# Patient Record
Sex: Female | Born: 1990 | Race: White | Hispanic: No | Smoking: Former smoker
Health system: Southern US, Community
[De-identification: ages and names within clinical notes are randomized; demographics above are authoritative.]

## PROBLEM LIST (undated history)

## (undated) DIAGNOSIS — K219 Gastro-esophageal reflux disease without esophagitis: Secondary | ICD-10-CM

## (undated) DIAGNOSIS — H579 Unspecified disorder of eye and adnexa: Secondary | ICD-10-CM

## (undated) DIAGNOSIS — F32A Depression, unspecified: Secondary | ICD-10-CM

## (undated) DIAGNOSIS — Z789 Other specified health status: Secondary | ICD-10-CM

## (undated) DIAGNOSIS — K5900 Constipation, unspecified: Secondary | ICD-10-CM

## (undated) DIAGNOSIS — F329 Major depressive disorder, single episode, unspecified: Secondary | ICD-10-CM

## (undated) HISTORY — PX: NO PAST SURGERIES: SHX2092

---
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
## 2010-04-17 ENCOUNTER — Emergency Department (HOSPITAL_COMMUNITY): Admission: EM | Admit: 2010-04-17 | Discharge: 2010-04-17 | Payer: Self-pay | Admitting: Family Medicine

## 2010-10-26 NOTE — L&D Delivery Note (Signed)
Delivery Note At 3:57 AM a viable female was delivered via Vaginal, Spontaneous Delivery (Presentation:ROA ).  APGAR: 9/9 ; weight 7+5 .   Placenta status: del spont.  Cord: short, but without other abnormalities.  Anesthesia: Epidural  Episiotomy: none Lacerations: none Est. Blood Loss (mL): 300cc  Mom to postpartum.  Baby to nursery-stable.  Cam Hai 07/15/2011, 4:24 AM

## 2010-11-11 ENCOUNTER — Encounter (INDEPENDENT_AMBULATORY_CARE_PROVIDER_SITE_OTHER): Payer: Self-pay | Admitting: Internal Medicine

## 2010-11-27 NOTE — Letter (Signed)
Summary: RECORDS FROM [facility]//HISTORICAL CHART  RECORDS FROM [facility]//HISTORICAL CHART   Imported By: Arta Bruce 11/11/2010 15:15:22  _____________________________________________________________________  External Attachment:    Type:   Image     Comment:   External Document

## 2011-02-05 ENCOUNTER — Other Ambulatory Visit: Payer: Self-pay | Admitting: Family Medicine

## 2011-02-05 DIAGNOSIS — Z3400 Encounter for supervision of normal first pregnancy, unspecified trimester: Secondary | ICD-10-CM

## 2011-02-05 DIAGNOSIS — Z3689 Encounter for other specified antenatal screening: Secondary | ICD-10-CM

## 2011-02-05 LAB — HEPATITIS B SURFACE ANTIGEN: Hepatitis B Surface Ag: NEGATIVE

## 2011-02-05 LAB — RPR: RPR: NONREACTIVE

## 2011-02-05 LAB — ANTIBODY SCREEN: Antibody Screen: NEGATIVE

## 2011-02-05 LAB — HIV ANTIBODY (ROUTINE TESTING W REFLEX): HIV: NONREACTIVE

## 2011-02-05 LAB — RUBELLA ANTIBODY, IGM: Rubella: IMMUNE

## 2011-02-05 LAB — ABO/RH: RH Type: POSITIVE

## 2011-02-12 ENCOUNTER — Ambulatory Visit (HOSPITAL_COMMUNITY)
Admission: RE | Admit: 2011-02-12 | Discharge: 2011-02-12 | Disposition: A | Discharge status: Home / Self Care | Payer: Medicaid Other | Source: Ambulatory Visit | Attending: Family Medicine | Admitting: Family Medicine

## 2011-02-12 DIAGNOSIS — O358XX0 Maternal care for other (suspected) fetal abnormality and damage, not applicable or unspecified: Secondary | ICD-10-CM | POA: Insufficient documentation

## 2011-02-12 DIAGNOSIS — Z3689 Encounter for other specified antenatal screening: Secondary | ICD-10-CM

## 2011-02-12 DIAGNOSIS — Z363 Encounter for antenatal screening for malformations: Secondary | ICD-10-CM | POA: Insufficient documentation

## 2011-02-12 DIAGNOSIS — Z1389 Encounter for screening for other disorder: Secondary | ICD-10-CM | POA: Insufficient documentation

## 2011-02-19 ENCOUNTER — Encounter (INDEPENDENT_AMBULATORY_CARE_PROVIDER_SITE_OTHER): Payer: Medicaid Other | Admitting: Obstetrics & Gynecology

## 2011-02-19 DIAGNOSIS — Z3480 Encounter for supervision of other normal pregnancy, unspecified trimester: Secondary | ICD-10-CM

## 2011-03-18 ENCOUNTER — Encounter (INDEPENDENT_AMBULATORY_CARE_PROVIDER_SITE_OTHER): Payer: Medicaid Other | Admitting: Family Medicine

## 2011-03-18 DIAGNOSIS — Z3480 Encounter for supervision of other normal pregnancy, unspecified trimester: Secondary | ICD-10-CM

## 2011-04-13 ENCOUNTER — Encounter (INDEPENDENT_AMBULATORY_CARE_PROVIDER_SITE_OTHER): Payer: Medicaid Other | Admitting: Obstetrics and Gynecology

## 2011-04-13 DIAGNOSIS — Z3480 Encounter for supervision of other normal pregnancy, unspecified trimester: Secondary | ICD-10-CM

## 2011-04-16 ENCOUNTER — Encounter: Payer: Medicaid Other | Admitting: Obstetrics & Gynecology

## 2011-04-28 ENCOUNTER — Encounter (INDEPENDENT_AMBULATORY_CARE_PROVIDER_SITE_OTHER): Payer: Medicaid Other | Admitting: Obstetrics & Gynecology

## 2011-04-28 DIAGNOSIS — Z3400 Encounter for supervision of normal first pregnancy, unspecified trimester: Secondary | ICD-10-CM

## 2011-04-28 DIAGNOSIS — IMO0002 Reserved for concepts with insufficient information to code with codable children: Secondary | ICD-10-CM

## 2011-05-14 ENCOUNTER — Encounter (INDEPENDENT_AMBULATORY_CARE_PROVIDER_SITE_OTHER): Payer: Medicaid Other | Admitting: Obstetrics & Gynecology

## 2011-05-14 DIAGNOSIS — Z3400 Encounter for supervision of normal first pregnancy, unspecified trimester: Secondary | ICD-10-CM

## 2011-05-14 DIAGNOSIS — IMO0002 Reserved for concepts with insufficient information to code with codable children: Secondary | ICD-10-CM

## 2011-06-10 ENCOUNTER — Encounter (INDEPENDENT_AMBULATORY_CARE_PROVIDER_SITE_OTHER): Payer: Medicaid Other | Admitting: Obstetrics & Gynecology

## 2011-06-10 DIAGNOSIS — Z3480 Encounter for supervision of other normal pregnancy, unspecified trimester: Secondary | ICD-10-CM

## 2011-06-10 DIAGNOSIS — IMO0002 Reserved for concepts with insufficient information to code with codable children: Secondary | ICD-10-CM

## 2011-06-17 ENCOUNTER — Ambulatory Visit (INDEPENDENT_AMBULATORY_CARE_PROVIDER_SITE_OTHER): Payer: Medicaid Other | Admitting: Obstetrics and Gynecology

## 2011-06-17 DIAGNOSIS — Z3480 Encounter for supervision of other normal pregnancy, unspecified trimester: Secondary | ICD-10-CM

## 2011-06-17 DIAGNOSIS — IMO0002 Reserved for concepts with insufficient information to code with codable children: Secondary | ICD-10-CM

## 2011-06-17 LAB — STREP B DNA PROBE: GBS: NEGATIVE

## 2011-06-18 LAB — GC/CHLAMYDIA PROBE AMP, GENITAL
Chlamydia, DNA Probe: NEGATIVE
GC Probe Amp, Genital: NEGATIVE

## 2011-06-20 LAB — CULTURE, BETA STREP (GROUP B ONLY)

## 2011-06-23 ENCOUNTER — Encounter (INDEPENDENT_AMBULATORY_CARE_PROVIDER_SITE_OTHER): Payer: Medicaid Other | Admitting: Family Medicine

## 2011-06-23 DIAGNOSIS — Z3400 Encounter for supervision of normal first pregnancy, unspecified trimester: Secondary | ICD-10-CM

## 2011-06-30 ENCOUNTER — Inpatient Hospital Stay (HOSPITAL_COMMUNITY)
Admission: EM | Admit: 2011-06-30 | Discharge: 2011-06-30 | Disposition: A | Discharge status: Home / Self Care | Payer: Medicaid Other | Source: Ambulatory Visit | Attending: Obstetrics & Gynecology | Admitting: Obstetrics & Gynecology

## 2011-06-30 ENCOUNTER — Encounter (HOSPITAL_COMMUNITY): Payer: Self-pay | Admitting: *Deleted

## 2011-06-30 ENCOUNTER — Encounter (INDEPENDENT_AMBULATORY_CARE_PROVIDER_SITE_OTHER): Payer: Medicaid Other | Admitting: Family Medicine

## 2011-06-30 DIAGNOSIS — O479 False labor, unspecified: Secondary | ICD-10-CM | POA: Insufficient documentation

## 2011-06-30 DIAGNOSIS — Z3400 Encounter for supervision of normal first pregnancy, unspecified trimester: Secondary | ICD-10-CM

## 2011-06-30 HISTORY — DX: Other specified health status: Z78.9

## 2011-06-30 NOTE — ED Notes (Signed)
Cervix exam 1.5/50/vt/-2 (posterior) called to Philipp Deputy CNM. Orders for discharge received.

## 2011-06-30 NOTE — Progress Notes (Signed)
SAYS HER UC STARTED AT 6PM.  [facility]- AT [facility]-  VE- TODAY- 2 CM.   DENIES HSV AND MRSA.

## 2011-07-07 ENCOUNTER — Encounter (INDEPENDENT_AMBULATORY_CARE_PROVIDER_SITE_OTHER): Payer: Medicaid Other | Admitting: Family Medicine

## 2011-07-07 DIAGNOSIS — Z3400 Encounter for supervision of normal first pregnancy, unspecified trimester: Secondary | ICD-10-CM

## 2011-07-14 ENCOUNTER — Encounter (HOSPITAL_COMMUNITY): Payer: Self-pay | Admitting: Anesthesiology

## 2011-07-14 ENCOUNTER — Inpatient Hospital Stay (HOSPITAL_COMMUNITY)
Admission: AD | Admit: 2011-07-14 | Discharge: 2011-07-17 | DRG: 775 | Disposition: A | Discharge status: Home / Self Care | Payer: Medicaid Other | Source: Ambulatory Visit | Attending: Obstetrics & Gynecology | Admitting: Obstetrics & Gynecology

## 2011-07-14 ENCOUNTER — Encounter: Payer: Medicaid Other | Admitting: Obstetrics & Gynecology

## 2011-07-14 ENCOUNTER — Encounter (HOSPITAL_COMMUNITY): Payer: Self-pay | Admitting: *Deleted

## 2011-07-14 ENCOUNTER — Inpatient Hospital Stay (HOSPITAL_COMMUNITY)
Admission: AD | Admit: 2011-07-14 | Discharge: 2011-07-14 | Disposition: A | Discharge status: Home / Self Care | Payer: Medicaid Other | Source: Ambulatory Visit | Attending: Obstetrics & Gynecology | Admitting: Obstetrics & Gynecology

## 2011-07-14 ENCOUNTER — Inpatient Hospital Stay (HOSPITAL_COMMUNITY): Payer: Medicaid Other | Admitting: Anesthesiology

## 2011-07-14 DIAGNOSIS — O479 False labor, unspecified: Secondary | ICD-10-CM | POA: Insufficient documentation

## 2011-07-14 LAB — URINALYSIS, ROUTINE W REFLEX MICROSCOPIC
Bilirubin Urine: NEGATIVE
Glucose, UA: NEGATIVE mg/dL
Hgb urine dipstick: NEGATIVE
Ketones, ur: NEGATIVE mg/dL
Nitrite: NEGATIVE
Protein, ur: NEGATIVE mg/dL
Specific Gravity, Urine: 1.01 (ref 1.005–1.030)
Urobilinogen, UA: 0.2 mg/dL (ref 0.0–1.0)
pH: 7 (ref 5.0–8.0)

## 2011-07-14 LAB — CBC
HCT: 41.6 % (ref 36.0–46.0)
Hemoglobin: 14.4 g/dL (ref 12.0–15.0)
MCH: 30.5 pg (ref 26.0–34.0)
MCHC: 34.6 g/dL (ref 30.0–36.0)
MCV: 88.1 fL (ref 78.0–100.0)
Platelets: 356 10*3/uL (ref 150–400)
RBC: 4.72 MIL/uL (ref 3.87–5.11)
RDW: 13.8 % (ref 11.5–15.5)
WBC: 17.1 10*3/uL — ABNORMAL HIGH (ref 4.0–10.5)

## 2011-07-14 LAB — URINE MICROSCOPIC-ADD ON

## 2011-07-14 MED ORDER — ONDANSETRON HCL 4 MG/2ML IJ SOLN: 4.0000 mg | Freq: Four times a day (QID) | INTRAMUSCULAR | Status: DC | PRN

## 2011-07-14 MED ORDER — ACETAMINOPHEN 325 MG PO TABS: 650.0000 mg | ORAL_TABLET | ORAL | Status: DC | PRN

## 2011-07-14 MED ORDER — NALBUPHINE SYRINGE 5 MG/0.5 ML
10.0000 mg | INJECTION | Freq: Once | INTRAMUSCULAR | Status: AC
  Administered 2011-07-14: 10 mg via INTRAMUSCULAR
  Filled 2011-07-14: qty 1

## 2011-07-14 MED ORDER — FLEET ENEMA 7-19 GM/118ML RE ENEM: 1.0000 | ENEMA | RECTAL | Status: DC | PRN

## 2011-07-14 MED ORDER — FENTANYL 2.5 MCG/ML BUPIVACAINE 1/10 % EPIDURAL INFUSION (WH - ANES)
14.0000 mL/h | INTRAMUSCULAR | Status: DC
  Administered 2011-07-14 – 2011-07-15 (×2): 14 mL/h via EPIDURAL
  Filled 2011-07-14 (×3): qty 60

## 2011-07-14 MED ORDER — LACTATED RINGERS IV SOLN
INTRAVENOUS | Status: DC
  Administered 2011-07-14: 19:00:00 via INTRAVENOUS
  Administered 2011-07-14: 400 mL via INTRAVENOUS
  Administered 2011-07-14 – 2011-07-15 (×2): via INTRAVENOUS

## 2011-07-14 MED ORDER — OXYCODONE-ACETAMINOPHEN 5-325 MG PO TABS: 2.0000 | ORAL_TABLET | ORAL | Status: DC | PRN

## 2011-07-14 MED ORDER — IBUPROFEN 600 MG PO TABS
600.0000 mg | ORAL_TABLET | Freq: Four times a day (QID) | ORAL | Status: DC | PRN
  Administered 2011-07-15: 600 mg via ORAL
  Filled 2011-07-14: qty 1

## 2011-07-14 MED ORDER — DIPHENHYDRAMINE HCL 50 MG/ML IJ SOLN: 12.5000 mg | INTRAMUSCULAR | Status: DC | PRN

## 2011-07-14 MED ORDER — OXYTOCIN BOLUS FROM INFUSION
500.0000 mL | Freq: Once | INTRAVENOUS | Status: DC
  Filled 2011-07-14: qty 500

## 2011-07-14 MED ORDER — EPHEDRINE 5 MG/ML INJ
10.0000 mg | INTRAVENOUS | Status: DC | PRN
  Filled 2011-07-14: qty 4

## 2011-07-14 MED ORDER — PHENYLEPHRINE 40 MCG/ML (10ML) SYRINGE FOR IV PUSH (FOR BLOOD PRESSURE SUPPORT)
80.0000 ug | PREFILLED_SYRINGE | INTRAVENOUS | Status: DC | PRN
  Filled 2011-07-14: qty 5

## 2011-07-14 MED ORDER — OXYTOCIN 20 UNITS IN LACTATED RINGERS INFUSION - SIMPLE
1.0000 m[IU]/min | INTRAVENOUS | Status: DC
  Administered 2011-07-14: 1 m[IU]/min via INTRAVENOUS
  Filled 2011-07-14: qty 1000

## 2011-07-14 MED ORDER — PHENYLEPHRINE 40 MCG/ML (10ML) SYRINGE FOR IV PUSH (FOR BLOOD PRESSURE SUPPORT)
80.0000 ug | PREFILLED_SYRINGE | INTRAVENOUS | Status: DC | PRN
  Filled 2011-07-14 (×2): qty 5

## 2011-07-14 MED ORDER — LACTATED RINGERS IV SOLN: 500.0000 mL | Freq: Once | INTRAVENOUS | Status: DC

## 2011-07-14 MED ORDER — OXYTOCIN 20 UNITS IN LACTATED RINGERS INFUSION - SIMPLE
125.0000 mL/h | Freq: Once | INTRAVENOUS | Status: AC
  Administered 2011-07-15: 999 mL/h via INTRAVENOUS

## 2011-07-14 MED ORDER — FENTANYL 2.5 MCG/ML BUPIVACAINE 1/10 % EPIDURAL INFUSION (WH - ANES)
INTRAMUSCULAR | Status: DC | PRN
  Administered 2011-07-14: 14 mL/h via EPIDURAL

## 2011-07-14 MED ORDER — LACTATED RINGERS IV SOLN: 500.0000 mL | INTRAVENOUS | Status: DC | PRN

## 2011-07-14 MED ORDER — LIDOCAINE HCL (PF) 1 % IJ SOLN
30.0000 mL | INTRAMUSCULAR | Status: DC | PRN
  Filled 2011-07-14 (×2): qty 30

## 2011-07-14 MED ORDER — LIDOCAINE HCL 1.5 % IJ SOLN
INTRAMUSCULAR | Status: DC | PRN
  Administered 2011-07-14 (×2): 5 mL via EPIDURAL

## 2011-07-14 MED ORDER — EPHEDRINE 5 MG/ML INJ
10.0000 mg | INTRAVENOUS | Status: DC | PRN
  Filled 2011-07-14 (×2): qty 4

## 2011-07-14 MED ORDER — TERBUTALINE SULFATE 1 MG/ML IJ SOLN: 0.2500 mg | Freq: Once | INTRAMUSCULAR | Status: AC | PRN

## 2011-07-14 MED ORDER — NALBUPHINE SYRINGE 5 MG/0.5 ML
10.0000 mg | INJECTION | INTRAMUSCULAR | Status: DC | PRN
  Filled 2011-07-14: qty 1

## 2011-07-14 MED ORDER — CITRIC ACID-SODIUM CITRATE 334-500 MG/5ML PO SOLN: 30.0000 mL | ORAL | Status: DC | PRN

## 2011-07-14 NOTE — ED Provider Notes (Signed)
I saw patient to re-evaluate for cervical change after an hour of monitoring.  Contractions have spaced out, but are still rated as strong. Dilation: 3 Effacement (%): 80 Cervical Position: Middle Station: -3 Presentation: Vertex Exam by:: Dr. Elwyn Reach  There has been no change in her cervical exam.  As she is uncomfortable and did not feel like she would be able to rest at home will give nubain 10mg  IM before discharge. -provided labor precautions -discharge home  Discussed plan of care with Philipp Deputy, CNM BOOTH, ERIN 07/14/2011 11:36 AM

## 2011-07-14 NOTE — Progress Notes (Signed)
Pt in for labor eval reports ucs since last night, has progressively gotten worse.  Denies any leaking of fluid or bleeding. + FM.

## 2011-07-14 NOTE — Anesthesia Procedure Notes (Signed)
Epidural Patient location during procedure: OB Start time: 07/14/2011 7:36 PM End time: 07/14/2011 7:45 PM Reason for block: procedure for pain  Staffing Anesthesiologist: Sandrea Hughs Performed by: anesthesiologist   Preanesthetic Checklist Completed: patient identified, site marked, surgical consent, pre-op evaluation, timeout performed, IV checked, risks and benefits discussed and monitors and equipment checked  Epidural Patient position: sitting Prep: site prepped and draped and DuraPrep Patient monitoring: continuous pulse ox and blood pressure Approach: midline Injection technique: LOR air  Needle:  Needle type: Tuohy  Needle gauge: 17 G Needle length: 9 cm Needle insertion depth: 7 cm Catheter type: closed end flexible Catheter size: 19 Gauge Catheter at skin depth: 12 cm Test dose: negative and 1.5% lidocaine  Assessment Sensory level: T8 Events: blood not aspirated, injection not painful, no injection resistance, negative IV test and no paresthesia

## 2011-07-14 NOTE — Anesthesia Preprocedure Evaluation (Signed)
Anesthesia Evaluation  Name, MR# and DOB Patient awake  General Assessment Comment  Reviewed: Allergy & Precautions, H&P , NPO status , Patient's Chart, lab work & pertinent test results  Airway Mallampati: III TM Distance: >3 FB Neck ROM: full    Dental No notable dental hx.    Pulmonary  clear to auscultation  pulmonary exam normalPulmonary Exam Normal breath sounds clear to auscultation none    Cardiovascular     Neuro/Psych Negative Neurological ROS  Negative Psych ROS  GI/Hepatic/Renal negative GI ROS  negative Liver ROS  negative Renal ROS        Endo/Other  Negative Endocrine ROS (+)      Abdominal (+) obese,   Musculoskeletal negative musculoskeletal ROS (+)   Hematology negative hematology ROS (+)   Peds  Reproductive/Obstetrics (+) Pregnancy    Anesthesia Other Findings             Anesthesia Physical Anesthesia Plan  ASA: III  Anesthesia Plan: Epidural   Post-op Pain Management:    Induction:   Airway Management Planned:   Additional Equipment:   Intra-op Plan:   Post-operative Plan:   Informed Consent: I have reviewed the patients History and Physical, chart, labs and discussed the procedure including the risks, benefits and alternatives for the proposed anesthesia with the patient or authorized representative who has indicated his/her understanding and acceptance.     Plan Discussed with:   Anesthesia Plan Comments:         Anesthesia Quick Evaluation

## 2011-07-14 NOTE — ED Provider Notes (Signed)
agree

## 2011-07-14 NOTE — Progress Notes (Signed)
Leslie Duran is a 20 y.o. G1P0 at [redacted]w[redacted]d    Subjective: Comfortable with epidural  Objective: BP 123/69  Pulse 87  Temp(Src) 98.3 F (36.8 C) (Oral)  Resp 18  Ht 5\' 4"  (1.626 m)  Wt 106.777 kg (235 lb 6.4 oz)  BMI 40.41 kg/m2  SpO2 100%      FHT:  FHR: 145 bpm, variability: moderate,  accelerations:  Present,  decelerations:  Absent; currently in sleep cycle, but prior to that with many 15x15 accels UC:   regular, every 2-4 minutes with 3 mu/min of Pitocin SVE:   Dilation: 5 Effacement (%): 80 Station: -1 Exam by:: Lilli Few, RN  Labs: Lab Results  Component Value Date   WBC 17.1* 07/14/2011   HGB 14.4 07/14/2011   HCT 41.6 07/14/2011   MCV 88.1 07/14/2011   PLT 356 07/14/2011    Assessment / Plan: Augmentation of labor, progressing well  Will continue to obs and reeval cx q 2 hours approx. Consider AROM when experiencing more cx change. Anticipate SVD.  Cam Hai 07/14/2011, 10:49 PM

## 2011-07-14 NOTE — H&P (Signed)
Leslie Duran is a 20 y.o. female presenting for contractions that have been going on all day.  She was here for a labor eval earlier today and d/c home with cx 3/80 and given IM Nubain prior to d/c. Now ctx have intensified. Denies leaking or bldg. Reports +FM. History OB History    Grav Para Term Preterm Abortions TAB SAB Ect Mult Living   1              Past Medical History  Diagnosis Date  . No pertinent past medical history   . Blood dyscrasia    Past Surgical History  Procedure Date  . No past surgeries    Family History: family history is not on file. Social History:  reports that she has never smoked. She has never used smokeless tobacco. She reports that she does not drink alcohol or use illicit drugs.  ROS  Dilation: 4 Effacement (%):  (85) Station: -2 Exam by:: Cletis Media, RN Blood pressure 128/77, pulse 97, temperature 97.6 F (36.4 C), temperature source Oral, resp. rate 22, height 5\' 4"  (1.626 m). Maternal Exam:  Uterine Assessment: Contraction strength is mild.  Contraction frequency is regular.   Abdomen: Patient reports no abdominal tenderness. Fetal presentation: vertex     Fetal Exam Fetal Monitor Review: Baseline rate: 130.  Variability: moderate (6-25 bpm).   Pattern: accelerations present and no decelerations.    Fetal State Assessment: Category I - tracings are normal.     Physical Exam  Constitutional: She is oriented to person, place, and time. She appears well-developed and well-nourished.  HENT:  Head: Normocephalic.  Respiratory: Effort normal.  Neurological: She is alert and oriented to person, place, and time.  Skin: Skin is warm and dry.  Psychiatric: She has a normal mood and affect.    Prenatal labs: ABO, Rh: O/Positive/-- (04/12 0000) Antibody: Negative (04/12 0000) Rubella:   RPR: Nonreactive (04/12 0000)  HBsAg: Negative (04/12 0000)  HIV: Non-reactive (04/12 0000)  GBS: Negative (08/22 0000)    Assessment/Plan: IUP at term Latent labor GBS neg  Will admit to L&D and give pain meds/epid as pt desires.  Expectant management. Anticipate SVD.   Cam Hai 07/14/2011, 7:34 PM

## 2011-07-14 NOTE — Progress Notes (Signed)
Pt returns to [facility] for labor check, reports ucs q3-4 minutes since leaving MAU earlier.  Denies any bleeding or lof.  + FM.

## 2011-07-15 ENCOUNTER — Encounter (HOSPITAL_COMMUNITY): Payer: Self-pay | Admitting: *Deleted

## 2011-07-15 LAB — RPR: RPR Ser Ql: NONREACTIVE

## 2011-07-15 MED ORDER — HYDROCORTISONE ACE-PRAMOXINE 1-1 % RE CREA
TOPICAL_CREAM | Freq: Three times a day (TID) | RECTAL | Status: DC
  Administered 2011-07-15 – 2011-07-17 (×3): via RECTAL
  Filled 2011-07-15: qty 30

## 2011-07-15 MED ORDER — TETANUS-DIPHTH-ACELL PERTUSSIS 5-2.5-18.5 LF-MCG/0.5 IM SUSP
0.5000 mL | Freq: Once | INTRAMUSCULAR | Status: AC
  Administered 2011-07-16: 0.5 mL via INTRAMUSCULAR
  Filled 2011-07-15: qty 0.5

## 2011-07-15 MED ORDER — BENZOCAINE-MENTHOL 20-0.5 % EX AERO
INHALATION_SPRAY | CUTANEOUS | Status: AC
  Filled 2011-07-15: qty 56

## 2011-07-15 MED ORDER — ONDANSETRON HCL 4 MG PO TABS: 4.0000 mg | ORAL_TABLET | ORAL | Status: DC | PRN

## 2011-07-15 MED ORDER — SENNOSIDES-DOCUSATE SODIUM 8.6-50 MG PO TABS
2.0000 | ORAL_TABLET | Freq: Every day | ORAL | Status: DC
Start: 2011-07-15 — End: 2011-07-17
  Administered 2011-07-15 – 2011-07-16 (×2): 2 via ORAL

## 2011-07-15 MED ORDER — LANOLIN HYDROUS EX OINT: TOPICAL_OINTMENT | CUTANEOUS | Status: DC | PRN

## 2011-07-15 MED ORDER — ONDANSETRON HCL 4 MG/2ML IJ SOLN: 4.0000 mg | INTRAMUSCULAR | Status: DC | PRN

## 2011-07-15 MED ORDER — DIBUCAINE 1 % RE OINT: 1.0000 "application " | TOPICAL_OINTMENT | RECTAL | Status: DC | PRN

## 2011-07-15 MED ORDER — ZOLPIDEM TARTRATE 5 MG PO TABS: 5.0000 mg | ORAL_TABLET | Freq: Every evening | ORAL | Status: DC | PRN

## 2011-07-15 MED ORDER — OXYCODONE-ACETAMINOPHEN 5-325 MG PO TABS
1.0000 | ORAL_TABLET | ORAL | Status: DC | PRN
  Administered 2011-07-16: 1 via ORAL
  Filled 2011-07-15: qty 1

## 2011-07-15 MED ORDER — SIMETHICONE 80 MG PO CHEW: 80.0000 mg | CHEWABLE_TABLET | ORAL | Status: DC | PRN

## 2011-07-15 MED ORDER — WITCH HAZEL-GLYCERIN EX PADS
1.0000 "application " | MEDICATED_PAD | CUTANEOUS | Status: DC | PRN
  Administered 2011-07-15: 1 via TOPICAL

## 2011-07-15 MED ORDER — IBUPROFEN 600 MG PO TABS
600.0000 mg | ORAL_TABLET | Freq: Four times a day (QID) | ORAL | Status: DC
  Administered 2011-07-15 – 2011-07-17 (×9): 600 mg via ORAL
  Filled 2011-07-15 (×9): qty 1

## 2011-07-15 MED ORDER — PRENATAL PLUS 27-1 MG PO TABS
1.0000 | ORAL_TABLET | Freq: Every day | ORAL | Status: DC
  Administered 2011-07-15 – 2011-07-17 (×3): 1 via ORAL
  Filled 2011-07-15 (×3): qty 1

## 2011-07-15 MED ORDER — DIPHENHYDRAMINE HCL 25 MG PO CAPS: 25.0000 mg | ORAL_CAPSULE | Freq: Four times a day (QID) | ORAL | Status: DC | PRN

## 2011-07-15 MED ORDER — BENZOCAINE-MENTHOL 20-0.5 % EX AERO
1.0000 "application " | INHALATION_SPRAY | CUTANEOUS | Status: DC | PRN
  Administered 2011-07-15: 1 via TOPICAL

## 2011-07-15 NOTE — Progress Notes (Signed)
UR chart review completed.  

## 2011-07-15 NOTE — Progress Notes (Signed)
Leslie Duran is a 20 y.o. G1P0 at [redacted]w[redacted]d  Subjective: Mostly comfortable with epidural, although having some irritation from the foley cath as well as pressure from hemorrhoids. RN unable to trace contractions well or consistently secondary to pt body habitus.  Objective: BP 121/69  Pulse 93  Temp(Src) 98.4 F (36.9 C) (Oral)  Resp 16  Ht 5\' 4"  (1.626 m)  Wt 106.777 kg (235 lb 6.4 oz)  BMI 40.41 kg/m2  SpO2 100%      FHT:  FHR: 145 bpm, variability: moderate,  accelerations:  Present,  decelerations:  Absent UC:   Appear to be every 3-5 but difficult to asses with toco; Pit at 70mu/min SVE:   7/100%/0  AROM for clear fluid and IUPC inserted Labs: Lab Results  Component Value Date   WBC 17.1* 07/14/2011   HGB 14.4 07/14/2011   HCT 41.6 07/14/2011   MCV 88.1 07/14/2011   PLT 356 07/14/2011    Assessment / Plan: Augmentation of labor, progressing well  Will reeval cervix in approx 2 hours or sooner with rectal pressure Proctocream HC to bedside for hemorrhoids SHAW, KIMBERLY 07/15/2011, 2:31 AM

## 2011-07-16 NOTE — Progress Notes (Signed)
Post Partum Day 1 Subjective: no complaints, up ad lib, voiding, tolerating PO, + flatus and pain in vagina and back that is controlled with medications.  Br feeding, has not seen lactation yet.  Progrestin-only pill vs nexplanon.  Objective: Blood pressure 108/71, pulse 97, temperature 97.9 F (36.6 C), temperature source Oral, resp. rate 18, height 5\' 4"  (1.626 m), weight 235 lb 6.4 oz (106.777 kg), SpO2 98.00%, unknown if currently breastfeeding.  Physical Exam:  General: alert, cooperative, appears stated age and no distress Lochia: appropriate Uterine Fundus: firm DVT Evaluation: No evidence of DVT seen on physical exam. Negative Homan's sign. No significant calf/ankle edema.   Basename 07/14/11 1801  HGB 14.4  HCT 41.6    Assessment/Plan: Plan for discharge tomorrow, Breastfeeding and Lactation consult   LOS: 2 days   BOOTH, Rishard Delange 07/16/2011, 8:45 AM

## 2011-07-17 MED ORDER — IBUPROFEN 600 MG PO TABS: 600.0000 mg | ORAL_TABLET | Freq: Four times a day (QID) | ORAL | Status: AC

## 2011-07-17 NOTE — Progress Notes (Signed)
Post Partum Day 2 Subjective: no complaints, up ad lib, voiding, tolerating PO, + flatus and +BM.  Seen by lactation yesterday - upset as baby will no longer latch on after being seen by lactation.  Objective: Blood pressure 121/86, pulse 93, temperature 98.1 F (36.7 C), temperature source Oral, resp. rate 18, height 5\' 4"  (1.626 m), weight 235 lb 6.4 oz (106.777 kg), SpO2 98.00%, unknown if currently breastfeeding.  Physical Exam:  General: alert, cooperative, appears stated age and no distress Lochia: appropriate Uterine Fundus: firm DVT Evaluation: No evidence of DVT seen on physical exam. Negative Homan's sign. No significant calf/ankle edema.   Basename 07/14/11 1801  HGB 14.4  HCT 41.6    Assessment/Plan: Discharge home   LOS: 3 days   BOOTH, Lonni Dirden 07/17/2011, 7:15 AM

## 2011-07-17 NOTE — Discharge Summary (Signed)
Obstetric Discharge Summary Reason for Admission: onset of labor Prenatal Procedures: ultrasound Intrapartum Procedures: spontaneous vaginal delivery Postpartum Procedures: none Complications-Operative and Postpartum: none Hemoglobin  Date Value Range Status  07/14/2011 14.4  12.0-15.0 (g/dL) Final     HCT  Date Value Range Status  07/14/2011 41.6  36.0-46.0 (%) Final    Discharge Diagnoses: Term Pregnancy-delivered  Discharge Information: Date: 07/17/2011 Activity: pelvic rest Diet: routine Medications: PNV and Ibuprophen Condition: stable Instructions: refer to practice specific booklet Discharge to: home Follow-up Information    Follow up with [facility] . Call today. (for 4-6 week PP visit)    Contact information:   [address] [place] [address]          Newborn Data: Live born female  Birth Weight: 7 lb 5.3 oz (3325 g) APGAR: 9, 9  Home with mother.  Duran, Leslie Wechter 07/17/2011, 7:31 AM

## 2011-07-17 NOTE — Anesthesia Postprocedure Evaluation (Signed)
Anesthesia Post Note  Patient: Leslie Duran  Procedure(s) Performed: * No procedures listed *  Anesthesia type: General  Patient location: PACU  Post pain: Pain level controlled  Post assessment: Post-op Vital signs reviewed  Last Vitals:  Filed Vitals:   07/17/11 0530  BP: 121/86  Pulse: 93  Temp: 98.1 F (36.7 C)  Resp: 18    Post vital signs: Reviewed  Level of consciousness: sedated  Complications: No apparent anesthesia complicationsfj

## 2011-07-21 ENCOUNTER — Inpatient Hospital Stay (HOSPITAL_COMMUNITY): Admit: 2011-07-21 | Discharge: 2011-07-21 | Payer: Medicaid Other

## 2011-08-05 ENCOUNTER — Ambulatory Visit (INDEPENDENT_AMBULATORY_CARE_PROVIDER_SITE_OTHER): Payer: Medicaid Other | Admitting: Obstetrics & Gynecology

## 2011-08-05 ENCOUNTER — Encounter: Payer: Self-pay | Admitting: Obstetrics & Gynecology

## 2011-08-05 ENCOUNTER — Ambulatory Visit: Payer: Medicaid Other | Admitting: Obstetrics & Gynecology

## 2011-08-05 VITALS — BP 105/63 | HR 77 | Wt 210.0 lb

## 2011-08-05 DIAGNOSIS — F53 Postpartum depression: Secondary | ICD-10-CM

## 2011-08-05 DIAGNOSIS — O99345 Other mental disorders complicating the puerperium: Secondary | ICD-10-CM

## 2011-08-05 DIAGNOSIS — Z23 Encounter for immunization: Secondary | ICD-10-CM

## 2011-08-05 MED ORDER — FLUOXETINE HCL 20 MG PO CAPS: 20.0000 mg | ORAL_CAPSULE | Freq: Every day | ORAL | Status: DC

## 2011-08-05 NOTE — Progress Notes (Signed)
  Subjective:    Patient ID: Leslie Duran, female    DOB: 08/28/1991, 19 y.o.   MRN: [id number]  HPI She is now 4 weeks status post NSVD and complains of constipation and pp depression.  She has had some thoughts of hurting herself, not baby, but has spokenly openly with her mother and me about this.  She is currently not suicidal.  Her baby is healthy but doesn't sleep much at night.  Her mother is very supportive.  She has not had sex yet.   Review of Systems     Objective:   Physical Exam        Assessment & Plan:  Pp depression- Prozac 20 mg q am.  She is very aware and agrees to tell me or her mother if she is feeling suicidal.  Miralax prn.

## 2011-08-11 ENCOUNTER — Encounter: Payer: Medicaid Other | Admitting: Obstetrics and Gynecology

## 2011-08-26 ENCOUNTER — Ambulatory Visit: Payer: Medicaid Other | Admitting: Family Medicine

## 2011-08-26 ENCOUNTER — Ambulatory Visit (INDEPENDENT_AMBULATORY_CARE_PROVIDER_SITE_OTHER): Payer: Medicaid Other | Admitting: Family Medicine

## 2011-08-26 ENCOUNTER — Encounter: Payer: Self-pay | Admitting: Family Medicine

## 2011-08-26 DIAGNOSIS — IMO0002 Reserved for concepts with insufficient information to code with codable children: Secondary | ICD-10-CM

## 2011-08-26 DIAGNOSIS — O99345 Other mental disorders complicating the puerperium: Secondary | ICD-10-CM

## 2011-08-26 DIAGNOSIS — F53 Postpartum depression: Secondary | ICD-10-CM

## 2011-08-26 NOTE — Progress Notes (Signed)
  Subjective:    Patient ID: Leslie Duran, female    DOB: 28-Jul-1991, 19 y.o.   MRN: [id number]  HPI Here for pp exam.  She has been depressed and is on Prozac which is working for her.  The baby is bottle feeding.  She is not sleeping through the night.  Leslie Duran is fatigued.  She is unsure about contraception at this point.  She is thinking about OC's or Implanon.  She is s/p SVD.   Review of Systems  Constitutional: Positive for fatigue. Negative for fever, activity change and appetite change.  HENT: Negative for congestion, rhinorrhea and sneezing.   Respiratory: Negative for chest tightness, shortness of breath and wheezing.   Cardiovascular: Negative for chest pain and leg swelling.  Gastrointestinal: Negative for nausea, vomiting and abdominal pain.  Genitourinary: Negative for dysuria and menstrual problem.  Psychiatric/Behavioral: Positive for dysphoric mood.       Objective:   Physical Exam  Vitals reviewed. Constitutional: She is oriented to person, place, and time. She appears well-developed and well-nourished.  HENT:  Head: Normocephalic and atraumatic.  Cardiovascular: Normal rate.   Pulmonary/Chest: Effort normal.  Abdominal: Soft. There is no tenderness.  Genitourinary: Vagina normal and uterus normal.       There is a scar noted at 8 o'clock at introitus that is firm and white.  It is well healed.    Neurological: She is alert and oriented to person, place, and time.          Assessment & Plan:  Pp check Depression-continue Prozac Info provided for contraceptive choices. Condoms for now F/u 2 mos for pap and BC

## 2011-08-26 NOTE — Patient Instructions (Signed)
Contraception Choices Birth control (contraception) can stop pregnancy from happening. Different types of birth control work in different ways. Some can:  Make the mucus in the cervix thick. This makes it hard for sperm to get into the uterus.   Thin the lining of the uterus. This makes it hard for an egg to attach to the wall of the uterus.   Stop the ovaries from releasing an egg.   Block the sperm from reaching the egg.  Certain types of surgery can stop pregnancy from happening. For women, the sugery closes the fallopian tubes (tubal ligation). For men, the surgery stops sperm from releasing during sex (vasectomy). HORMONAL BIRTH CONTROL Hormonal birth control stops pregnancy by putting hormones into your body. Types of birth control include:  A small tube put under the skin of the upper arm (implant). The tube can stay in place for 3 years.   Shots given every 3 months.   Pills taken every day or once after sex (intercourse).   Patches that are changed once a week.   A ring put into the vagina (vaginal ring). The ring is left in place for 3 weeks and removed for 1 week. Then, a new ring is put in the vagina.  BARRIER BIRTH CONTROL  Barrier birth control blocks sperm from reaching the egg. Types of birth control include:   A thin covering worn on the penis (female condom) during sex.   A soft, loose covering put into the vagina (female condom) before sex.   A rubber bowl that sits over the cervix (diaphragm). The bowl must be made for you. The bowl is put into the vagina before sex. The bowl is left in place for 6 to 8 hours after sex.   A small, soft cup that fits over the cervix (cervical cap). The cup must be made for you. The cup can be left in place for 48 hours after sex.   A sponge that is put into the vagina before sex.   A chemical that kills or blocks sperm from getting into the cervix and uterus (spermicide). The chemical may be a cream, jelly, foam, or pill.    INTRAUTERINE (IUD) BIRTH CONTROL  IUD birth control is a small, T-shaped piece of plastic. The plastic is put inside the uterus. There are 2 types of IUD:  Copper IUD. The IUD is covered in copper wire. The copper makes a fluid that kills sperm. It can stay in place for 10 years.   Hormone IUD. The hormone stops pregnancy from happening. It can stay in place for 5 years.  NATURAL FAMILY PLANNING BIRTH CONTROL  Natural family planning means not having sex or using barrier birth control when the woman is fertile. A woman can:  Use a calendar to keep track of when she is fertile.   Use a thermometer to measure her body temperature.  Protect yourself against sexual diseases no matter what type of birth control you use. Talk to your doctor about which type of birth control is best for you. Document Released: 08/09/2009 Document Revised: 06/24/2011 Document Reviewed: 02/18/2011 [facility] Patient Information 2012 [place], [place].Postpartum Depression After delivery, your body is going through a drastic change in hormone levels. You may find yourself crying for no apparent reason and unable to cope with all the changes a new baby brings. This is a common response following a pregnancy. Seek support from your partner and/or friends and just give yourself time to recover. If these feelings persist and  you feel you are getting worse, contact your caregiver or other professionals who can help you. WHAT IS DEPRESSION? Depression can be described as feeling sad, blue, unhappy, miserable, or down in the dumps. Most of [place] feel this way at one time or another for short periods. But true clinical depression is a mood disorder in which feelings of sadness, loss, anger, fear, or frustration interfere with everyday life for an extended time. Depression can be mild, moderate, or severe. The degree of depression, which your caregiver can determine, influences your treatment. Postpartum depression occurs within a couple  days to months after delivering your baby. HOW COMMON IS DEPRESSION DURING AND AFTER PREGNANCY? Depression that occurs during pregnancy or within a year after delivery is called perinatal depression. Depression after pregnancy is also called postpartum depression or peripartum depression. The exact number of women with depression during this time is unknown, but it occurs in between 10-15% of women. Researchers believe that depression is one of the most common complications during and after pregnancy. The depression is often not recognized or treated, because some normal pregnancy changes cause similar symptoms and are happening at the same time. Tiredness, problems sleeping, stronger emotional reactions, and changes in body weight may occur during and after pregnancy. But these symptoms may also be signs of depression.  CAUSES  Rapid hormone changes. Estrogen and progesterone usually decrease immediately after delivering your baby. Researchers think the fast change in hormone levels may lead to depression, just as smaller changes in hormones can affect a woman's moods before she gets her menstrual period.   Decrease in thyroid hormone. Thyroid hormone regulates how your body uses and stores energy from food (metabolism). A simple blood test can tell if this condition is causing a woman's depression. If so, thyroid medicine can be prescribed by your caregiver.   A stressful life event, such as a death in the family. This can cause chemical changes in the brain that lead to depression.   Feeling overwhelmed by caring for and raising a new baby.   Depression is also an illness that runs in some families. It is not always clear what causes depression.  FACTORS THAT MAY INCREASE A WOMAN'S CHANCE OF DEPRESSION DURING PREGNANCY:  History of depression.   Substance abuse, alcohol, or drugs.   Little support from family and friends.   Problems with previous pregnancy or birth.   Young age for  motherhood.   Living alone.   Little or no social support.   Family history of mental illness.   Anxiety about the fetus.   Marital or financial problems.   Postpartum depression in a previous pregnancy.   Having a psychiatric illness (schizophrenia, bipolar disorder).   Going through a difficult or stressful pregnancy.   Going through a difficult labor and delivery.   Moving to another city or state during your pregnancy, or just after delivering your baby.  OTHER FACTORS THAT MAY CONTRIBUTE TO POSTPARTUM DEPRESSION INCLUDE:   Feeling tired after delivery, broken sleep patterns, and not getting enough rest. This often keeps a new mother from regaining her full strength for weeks.   Feeling overwhelmed with a new baby to take care of and doubting your ability to be a good mother.   Feeling stress from changes in work and home routines. Women sometimes think they need to be "super mom" or perfect. This is not realistic and can add stress.   Having feelings of loss. This can include loss of the identity of  who you are, or were, before having the baby, loss of control, loss of your pre-pregnancy figure, and feeling less attractive.   Having less free time and less control over your time. Needing to stay home, indoors, for longer periods of time and having less time to spend with your partner and loved ones can contribute to depression.   Having trouble doing your daily activities at home or at work.   Fears about not knowing how to take of the baby correctly and about harming the baby.   Feelings of guilt that you are not taking care of the baby properly.  SYMPTOMS Any of these symptoms, during and after pregnancy, that last longer than 2 weeks are signs of depression:  Feeling restless or irritable.   Feeling sad, hopeless, and overwhelmed.   Crying a lot.   Having no energy or motivation.   Eating too little or too much.   Sleeping too little or too much.   Trouble  focusing, remembering, or making decisions.   Feeling worthless and guilty.   Loss of interest or pleasure in activities.   Withdrawal from friends and family.   Having headaches, chest pains, rapid or irregular heartbeat (palpitations), or fast and shallow breathing (hyperventilation).   After pregnancy, being afraid of hurting the baby or oneself, and not having any interest in the baby.   Not being able to care for yourself or the baby.   Loss of interest in caring for the baby.   Anxiety and panic attacks.   Thoughts of harming yourself, the baby, or someone else.   Feelings of guilt because you feel you are not taking care of the baby well enough.  WHAT IS THE DIFFERENCE BETWEEN "BABY BLUES," POSTPARTUM DEPRESSION, AND POSTPARTUM PSYCHOSIS?  The "baby blues" occurs 70 to 80% of the time, and it can happen in the days right after childbirth. It normally goes away within a few days to a week. A new mother can have sudden mood swings, sadness, crying spells, loss of appetite, sleeping problems, and feel irritable, restless, anxious, and lonely. Symptoms are not severe and treatment usually is not needed. But there are things you can do to feel better. Nap when the baby does. Ask for help from your spouse, family members, and friends. Join a support group of new moms or talk with other moms. If the "baby blues" does not go away in a week to 10 days or gets worse, you may have postpartum depression.   Postpartum depression can happen anytime within the first year after childbirth. A woman may have a number of symptoms, such as sadness, lack of energy, trouble concentrating, anxiety, and feelings of guilt and worthlessness. The difference between postpartum depression and the "baby blues" is that the feelings in postpartum depression are much stronger and often affects a woman's well-being. It keeps her from functioning well for a longer period of time. Postpartum depression needs to be  treated by a caregiver. Counseling, support groups, and medicines can help.   Postpartum psychosis is rare. It occurs in 1 or 2 out of every 1000 births. It usually begins in the first 6 weeks after delivery. Women who have bipolar disorder, schizoaffective disorder, or family history of psychotic disease have a higher risk for developing postpartum psychosis. Symptoms may include delusions, hallucinations, sleep disturbances, and obsessive thoughts about the baby. A woman may have rapid mood swings, from depression, to irritability, to euphoria. This is a serious condition and needs professional care  and treatment.  WHAT STEPS CAN I TAKE IF I HAVE SYMPTOMS OF DEPRESSION DURING PREGNANCY OR AFTER CHILDBIRTH?  Some women do not tell anyone about their symptoms, because they feel embarrassed, ashamed, or guilty about feeling depressed when they are supposed to be happy. They worry that they will be viewed as unfit parents. Perinatal depression can happen to any woman. It does not mean you are a bad or a "not together" mom. You and your baby do not need to suffer. There is help. You should discuss these feelings with your spouse or partner, family, and caregiver.   There are different types of individual and group "talk therapies" that can help a woman with perinatal depression feel better and do better as a mom and as a person. Limited research suggests that many women with perinatal depression improve when treated with antidepressant medicine. Your caregiver can help you learn more about these options and decide which approach is best for you and your baby.   Speak to your caregiver if you are having symptoms of depression while you are pregnant or after you deliver your baby. Your caregiver can give you a questionnaire to test for depression. You can also be referred to a mental health professional who specializes in treating depression.  HOME CARE INSTRUCTIONS  Try to get as much rest as you can. Try to  nap when the baby naps.   Stop putting pressure on yourself to do everything. Do as much as you can and leave the rest.   Ask for help with household chores and nighttime feedings. Ask your partner to bring the baby to you so you can breastfeed. If you can, have a friend, family member, or professional support person help you in the home for part of the day.   Talk to your partner, family, and friends about how you are feeling.   Do not spend a lot of time alone. Get dressed and leave the house. Run an errand or take a short walk.   Spend time alone with your partner.   Talk with other mothers so you can learn from their experiences.   Join a support group for women with depression. Call a local hotline or look in your telephone book for information and services.   Do not make any major life changes during pregnancy. Major changes can cause unneeded stress. However, sometimes big changes cannot be avoided. Arrange support and help in your new situation ahead of time.   Exercise regularly.   Eat a balanced and nourishing diet.   Seek help if there are marital or financial problems.   Take the medicine your caregiver gives, as directed.   Keep all your postpartum appointments.  TREATMENT There are 2 common types of treatment for depression.  Talk therapy. This involves talking to a therapist, psychologist, clergyperson, or social worker, in order to learn to change how depression makes you think, feel, and act.   Medicine. Your caregiver can give you an antidepressant medicine to help you. These medicines can help relieve the symptoms of depression.   Women who are pregnant or breast-feeding should talk with their caregivers about the advantages and risks of taking antidepressant medicines. Some women are concerned that taking these medicines may harm the baby. A mother's depression can affect her baby's development. Getting treatment is important for both mother and baby. The risks  of taking medicine must be weighed against the risks of depression. It is a decision that women need to discuss  carefully with their caregivers. Women who decide to take antidepressant medicines should talk to their caregivers about which antidepressant medicines are safer to take while pregnant or breastfeeding.  What effects can untreated depression have?  Depression not only hurts the mother, but it also affects her family. Some researchers have found that depression during pregnancy can raise the risk of delivering an underweight baby or a premature infant. Some women with depression have difficulty caring for themselves during pregnancy. They may have trouble eating and do not gain enough weight during the pregnancy. They may also have trouble sleeping, may miss prenatal visits, may not follow medical instructions, have a poor diet, or may use harmful substances, like tobacco, alcohol, or illegal drugs.   Postpartum depression can affect a mother's ability to parent. She may lack energy, have trouble concentrating, be irritable, and not be able to meet her child's needs for love and affection. As a result, she may feel guilty and lose confidence in herself as a mother. This can make the depression worse. Researchers believe that postpartum depression can affect the infant by causing delays in language development, problems with emotional bonding to others, behavioral problems, lower activity levels, sleep problems, and distress. It helps if the father or another caregiver can assist in meeting the needs of the baby, and other children in the family, while the mother is depressed.   All children deserve the chance to have a healthy mom. All moms deserve the chance to enjoy their life and their children. Do not suffer alone. If you are experiencing symptoms of depression during pregnancy or after having a baby, tell a loved one and call your caregiver right away.  SEEK MEDICAL CARE IF:  You think you  have postpartum depression.   You want medicine to treat your postpartum depression.   You want a referral to a psychiatrist or psychologist.   You are having a reaction or problems with your medicine.  SEEK IMMEDIATE MEDICAL CARE IF:  You have suicidal feelings.   You feel you may harm the baby.   You feel you may harm your spouse/partner, or someone else.   You feel you need to be admitted to a hospital now.   You feel you are losing control and need treatment immediately.  FOR MORE INFORMATION [employer] operational [employer] Health Information Center: http://hoffman.com/ National Institute of Mental Health, NIH, HHS: http://www.maynard.net/ American Psychological Association: DiceTournament.ca  Postpartum Education for Parents: www.sbpep.org National Mental Health Information Center, SAMHSA, HHS: www.mentalhealth.org  National Mental Health Association: www.nmha.org Postpartum Support International: www.postpartum.net  Document Released: 07/16/2004 Document Revised: 06/24/2011 Document Reviewed: 10/24/2009 [facility] Patient Information 2012 [place], [place].

## 2011-10-22 ENCOUNTER — Ambulatory Visit: Payer: Medicaid Other | Admitting: Obstetrics & Gynecology

## 2012-05-22 ENCOUNTER — Emergency Department: Payer: Self-pay | Admitting: Emergency Medicine

## 2012-08-25 ENCOUNTER — Encounter: Payer: Medicaid Other | Admitting: Family Medicine

## 2012-08-25 ENCOUNTER — Ambulatory Visit (INDEPENDENT_AMBULATORY_CARE_PROVIDER_SITE_OTHER): Payer: Medicaid Other | Admitting: *Deleted

## 2012-08-25 VITALS — BP 107/78 | Wt 233.0 lb

## 2012-08-25 DIAGNOSIS — Z3480 Encounter for supervision of other normal pregnancy, unspecified trimester: Secondary | ICD-10-CM

## 2012-08-25 MED ORDER — PRENATAL PLUS 27-1 MG PO TABS: 1.0000 | ORAL_TABLET | Freq: Every day | ORAL | Status: DC

## 2012-08-25 NOTE — Progress Notes (Signed)
Patient is unsure of her lmp.

## 2012-08-27 LAB — OBSTETRIC PANEL
Antibody Screen: NEGATIVE
Basophils Absolute: 0.1 10*3/uL (ref 0.0–0.1)
Basophils Relative: 1 % (ref 0–1)
Eosinophils Absolute: 0.3 10*3/uL (ref 0.0–0.7)
Eosinophils Relative: 2 % (ref 0–5)
HCT: 39.1 % (ref 36.0–46.0)
Hemoglobin: 12.8 g/dL (ref 12.0–15.0)
Hepatitis B Surface Ag: NEGATIVE
Lymphocytes Relative: 21 % (ref 12–46)
Lymphs Abs: 2.4 10*3/uL (ref 0.7–4.0)
MCH: 28.9 pg (ref 26.0–34.0)
MCHC: 32.7 g/dL (ref 30.0–36.0)
MCV: 88.3 fL (ref 78.0–100.0)
Monocytes Absolute: 0.6 10*3/uL (ref 0.1–1.0)
Monocytes Relative: 5 % (ref 3–12)
Neutro Abs: 8.1 10*3/uL — ABNORMAL HIGH (ref 1.7–7.7)
Neutrophils Relative %: 71 % (ref 43–77)
Platelets: 443 10*3/uL — ABNORMAL HIGH (ref 150–400)
RBC: 4.43 MIL/uL (ref 3.87–5.11)
RDW: 14.6 % (ref 11.5–15.5)
Rh Type: POSITIVE
Rubella: 58.7 IU/mL — ABNORMAL HIGH
WBC: 11.4 10*3/uL — ABNORMAL HIGH (ref 4.0–10.5)

## 2012-08-27 LAB — HIV ANTIBODY (ROUTINE TESTING W REFLEX): HIV: NONREACTIVE

## 2012-08-30 ENCOUNTER — Ambulatory Visit (HOSPITAL_COMMUNITY)
Admission: RE | Admit: 2012-08-30 | Discharge: 2012-08-30 | Disposition: A | Discharge status: Home / Self Care | Payer: Medicaid Other | Source: Ambulatory Visit | Attending: Obstetrics & Gynecology | Admitting: Obstetrics & Gynecology

## 2012-08-30 ENCOUNTER — Other Ambulatory Visit (HOSPITAL_COMMUNITY)
Admission: RE | Admit: 2012-08-30 | Discharge: 2012-08-30 | Disposition: A | Discharge status: Home / Self Care | Payer: Medicaid Other | Source: Ambulatory Visit | Attending: Obstetrics and Gynecology | Admitting: Obstetrics and Gynecology

## 2012-08-30 ENCOUNTER — Ambulatory Visit (INDEPENDENT_AMBULATORY_CARE_PROVIDER_SITE_OTHER): Payer: Medicaid Other | Admitting: Obstetrics and Gynecology

## 2012-08-30 ENCOUNTER — Encounter: Payer: Self-pay | Admitting: Obstetrics and Gynecology

## 2012-08-30 ENCOUNTER — Other Ambulatory Visit: Payer: Self-pay | Admitting: Obstetrics & Gynecology

## 2012-08-30 VITALS — BP 116/55 | Wt 235.0 lb

## 2012-08-30 DIAGNOSIS — F32A Depression, unspecified: Secondary | ICD-10-CM

## 2012-08-30 DIAGNOSIS — Z3689 Encounter for other specified antenatal screening: Secondary | ICD-10-CM | POA: Insufficient documentation

## 2012-08-30 DIAGNOSIS — Z3480 Encounter for supervision of other normal pregnancy, unspecified trimester: Secondary | ICD-10-CM

## 2012-08-30 DIAGNOSIS — Z8659 Personal history of other mental and behavioral disorders: Secondary | ICD-10-CM | POA: Insufficient documentation

## 2012-08-30 DIAGNOSIS — Z113 Encounter for screening for infections with a predominantly sexual mode of transmission: Secondary | ICD-10-CM | POA: Insufficient documentation

## 2012-08-30 DIAGNOSIS — Z01419 Encounter for gynecological examination (general) (routine) without abnormal findings: Secondary | ICD-10-CM | POA: Insufficient documentation

## 2012-08-30 DIAGNOSIS — O09299 Supervision of pregnancy with other poor reproductive or obstetric history, unspecified trimester: Secondary | ICD-10-CM

## 2012-08-30 DIAGNOSIS — O99891 Other specified diseases and conditions complicating pregnancy: Secondary | ICD-10-CM

## 2012-08-30 DIAGNOSIS — F329 Major depressive disorder, single episode, unspecified: Secondary | ICD-10-CM

## 2012-08-30 DIAGNOSIS — Z302 Encounter for sterilization: Secondary | ICD-10-CM

## 2012-08-30 DIAGNOSIS — F3289 Other specified depressive episodes: Secondary | ICD-10-CM

## 2012-08-30 MED ORDER — INFLUENZA VIRUS VACC SPLIT PF IM SUSP: 0.5000 mL | Freq: Once | INTRAMUSCULAR | Status: DC

## 2012-08-30 NOTE — Progress Notes (Signed)
   Subjective:    Leslie Duran is a G2P1001 [redacted]w[redacted]d being seen today for her first obstetrical visit.  Her obstetrical history is significant for h/o postpartum depression. Patient does intend to breast feed. Pregnancy history fully reviewed. Patient planning on BTL for birth control.  Patient reports no obstetrical complaints but symptoms of depression. Patient has a prescription for zoloft which she is planning to fill today. It has helped her in the past successfully.  There were no vitals filed for this visit.  HISTORY: OB History    Grav Para Term Preterm Abortions TAB SAB Ect Mult Living   2 1 1       1      # Outc Date GA Lbr Len/2nd Wgt Sex Del Anes PTL Lv   1 TRM 9/12 [redacted]w[redacted]d 30:30 / 00:27 7lb5.3oz(3.325kg) F SVD EPI  Yes   2 CUR              Past Medical History  Diagnosis Date  . No pertinent past medical history    Past Surgical History  Procedure Date  . No past surgeries    Family History  Problem Relation Age of Onset  . Diabetes Maternal Grandmother   . Heart disease Maternal Grandmother   . Stroke Maternal Grandmother      Exam    Uterus:     Pelvic Exam:    Perineum: Normal Perineum   Vulva: normal   Vagina:  normal mucosa, normal discharge   pH:    Cervix: multiparous appearance, closed and long   Adnexa: no mass, fullness, tenderness   Bony Pelvis: android  System: Breast:  normal appearance, no masses or tenderness   Skin: normal coloration and turgor, no rashes    Neurologic: oriented, grossly non-focal   Extremities: normal strength, tone, and muscle mass   HEENT extra ocular movement intact   Mouth/Teeth mucous membranes moist, pharynx normal without lesions and dental hygiene good   Neck supple and no masses   Cardiovascular: regular rate and rhythm   Respiratory:  chest clear, no wheezing, crepitations, rhonchi, normal symmetric air entry   Abdomen: soft, non-tender; bowel sounds normal; no masses,  no organomegaly   Urinary:       Assessment:    Pregnancy: G2P1001 Patient Active Problem List  Diagnosis  . Postpartum depression  . History of postpartum depression, currently pregnant  . Supervision of other normal pregnancy        Plan:     Initial labs drawn. Prenatal vitamins. Problem list reviewed and updated. Genetic Screening discussed Quad Screen: declined.  Ultrasound discussed; fetal survey: ordered.  Follow up in 4 weeks. 50% of 30 min visit spent on counseling and coordination of care.     Bette Brienza 08/30/2012

## 2012-08-30 NOTE — Patient Instructions (Signed)
Pregnancy - Second Trimester The second trimester of pregnancy (3 to 6 months) is a period of rapid growth for you and your baby. At the end of the sixth month, your baby is about 9 inches long and weighs 1 1/2 pounds. You will begin to feel the baby move between 18 and 20 weeks of the pregnancy. This is called quickening. Weight gain is faster. A clear fluid (colostrum) may leak out of your breasts. You may feel small contractions of the womb (uterus). This is known as false labor or Braxton-Hicks contractions. This is like a practice for labor when the baby is ready to be born. Usually, the problems with morning sickness have usually passed by the end of your first trimester. Some women develop small dark blotches (called cholasma, mask of pregnancy) on their face that usually goes away after the baby is born. Exposure to the sun makes the blotches worse. Acne may also develop in some pregnant women and pregnant women who have acne, may find that it goes away. PRENATAL EXAMS  Blood work may continue to be done during prenatal exams. These tests are done to check on your health and the probable health of your baby. Blood work is used to follow your blood levels (hemoglobin). Anemia (low hemoglobin) is common during pregnancy. Iron and vitamins are given to help prevent this. You will also be checked for diabetes between 24 and 28 weeks of the pregnancy. Some of the previous blood tests may be repeated.  The size of the uterus is measured during each visit. This is to make sure that the baby is continuing to grow properly according to the dates of the pregnancy.  Your blood pressure is checked every prenatal visit. This is to make sure you are not getting toxemia.  Your urine is checked to make sure you do not have an infection, diabetes or protein in the urine.  Your weight is checked often to make sure gains are happening at the suggested rate. This is to ensure that both you and your baby are  growing normally.  Sometimes, an ultrasound is performed to confirm the proper growth and development of the baby. This is a test which bounces harmless sound waves off the baby so your caregiver can more accurately determine due dates. Sometimes, a specialized test is done on the amniotic fluid surrounding the baby. This test is called an amniocentesis. The amniotic fluid is obtained by sticking a needle into the belly (abdomen). This is done to check the chromosomes in instances where there is a concern about possible genetic problems with the baby. It is also sometimes done near the end of pregnancy if an early delivery is required. In this case, it is done to help make sure the baby's lungs are mature enough for the baby to live outside of the womb. CHANGES OCCURING IN THE SECOND TRIMESTER OF PREGNANCY Your body goes through many changes during pregnancy. They vary from person to person. Talk to your caregiver about changes you notice that you are concerned about.  During the second trimester, you will likely have an increase in your appetite. It is normal to have cravings for certain foods. This varies from person to person and pregnancy to pregnancy.  Your lower abdomen will begin to bulge.  You may have to urinate more often because the uterus and baby are pressing on your bladder. It is also common to get more bladder infections during pregnancy (pain with urination). You can help this by   drinking lots of fluids and emptying your bladder before and after intercourse.  You may begin to get stretch marks on your hips, abdomen, and breasts. These are normal changes in the body during pregnancy. There are no exercises or medications to take that prevent this change.  You may begin to develop swollen and bulging veins (varicose veins) in your legs. Wearing support hose, elevating your feet for 15 minutes, 3 to 4 times a day and limiting salt in your diet helps lessen the problem.  Heartburn may  develop as the uterus grows and pushes up against the stomach. Antacids recommended by your caregiver helps with this problem. Also, eating smaller meals 4 to 5 times a day helps.  Constipation can be treated with a stool softener or adding bulk to your diet. Drinking lots of fluids, vegetables, fruits, and whole grains are helpful.  Exercising is also helpful. If you have been very active up until your pregnancy, most of these activities can be continued during your pregnancy. If you have been less active, it is helpful to start an exercise program such as walking.  Hemorrhoids (varicose veins in the rectum) may develop at the end of the second trimester. Warm sitz baths and hemorrhoid cream recommended by your caregiver helps hemorrhoid problems.  Backaches may develop during this time of your pregnancy. Avoid heavy lifting, wear low heal shoes and practice good posture to help with backache problems.  Some pregnant women develop tingling and numbness of their hand and fingers because of swelling and tightening of ligaments in the wrist (carpel tunnel syndrome). This goes away after the baby is born.  As your breasts enlarge, you may have to get a bigger bra. Get a comfortable, cotton, support bra. Do not get a nursing bra until the last month of the pregnancy if you will be nursing the baby.  You may get a dark line from your belly button to the pubic area called the linea nigra.  You may develop rosy cheeks because of increase blood flow to the face.  You may develop spider looking lines of the face, neck, arms and chest. These go away after the baby is born. HOME CARE INSTRUCTIONS   It is extremely important to avoid all smoking, herbs, alcohol, and unprescribed drugs during your pregnancy. These chemicals affect the formation and growth of the baby. Avoid these chemicals throughout the pregnancy to ensure the delivery of a healthy infant.  Most of your home care instructions are the same  as suggested for the first trimester of your pregnancy. Keep your caregiver's appointments. Follow your caregiver's instructions regarding medication use, exercise and diet.  During pregnancy, you are providing food for you and your baby. Continue to eat regular, well-balanced meals. Choose foods such as meat, fish, milk and other low fat dairy products, vegetables, fruits, and whole-grain breads and cereals. Your caregiver will tell you of the ideal weight gain.  A physical sexual relationship may be continued up until near the end of pregnancy if there are no other problems. Problems could include early (premature) leaking of amniotic fluid from the membranes, vaginal bleeding, abdominal pain, or other medical or pregnancy problems.  Exercise regularly if there are no restrictions. Check with your caregiver if you are unsure of the safety of some of your exercises. The greatest weight gain will occur in the last 2 trimesters of pregnancy. Exercise will help you:  Control your weight.  Get you in shape for labor and delivery.  Lose weight   after you have the baby.  Wear a good support or jogging bra for breast tenderness during pregnancy. This may help if worn during sleep. Pads or tissues may be used in the bra if you are leaking colostrum.  Do not use hot tubs, steam rooms or saunas throughout the pregnancy.  Wear your seat belt at all times when driving. This protects you and your baby if you are in an accident.  Avoid raw meat, uncooked cheese, cat litter boxes and soil used by cats. These carry germs that can cause birth defects in the baby.  The second trimester is also a good time to visit your dentist for your dental health if this has not been done yet. Getting your teeth cleaned is OK. Use a soft toothbrush. Brush gently during pregnancy.  It is easier to loose urine during pregnancy. Tightening up and strengthening the pelvic muscles will help with this problem. Practice stopping  your urination while you are going to the bathroom. These are the same muscles you need to strengthen. It is also the muscles you would use as if you were trying to stop from passing gas. You can practice tightening these muscles up 10 times a set and repeating this about 3 times per day. Once you know what muscles to tighten up, do not perform these exercises during urination. It is more likely to contribute to an infection by backing up the urine.  Ask for help if you have financial, counseling or nutritional needs during pregnancy. Your caregiver will be able to offer counseling for these needs as well as refer you for other special needs.  Your skin may become oily. If so, wash your face with mild soap, use non-greasy moisturizer and oil or cream based makeup. MEDICATIONS AND DRUG USE IN PREGNANCY  Take prenatal vitamins as directed. The vitamin should contain 1 milligram of folic acid. Keep all vitamins out of reach of children. Only a couple vitamins or tablets containing iron may be fatal to a baby or young child when ingested.  Avoid use of all medications, including herbs, over-the-counter medications, not prescribed or suggested by your caregiver. Only take over-the-counter or prescription medicines for pain, discomfort, or fever as directed by your caregiver. Do not use aspirin.  Let your caregiver also know about herbs you may be using.  Alcohol is related to a number of birth defects. This includes fetal alcohol syndrome. All alcohol, in any form, should be avoided completely. Smoking will cause low birth rate and premature babies.  Street or illegal drugs are very harmful to the baby. They are absolutely forbidden. A baby born to an addicted mother will be addicted at birth. The baby will go through the same withdrawal an adult does. SEEK MEDICAL CARE IF:  You have any concerns or worries during your pregnancy. It is better to call with your questions if you feel they cannot wait,  rather than worry about them. SEEK IMMEDIATE MEDICAL CARE IF:   An unexplained oral temperature above 100.4 F (38 C) develops, or as your caregiver suggests.  You have leaking of fluid from the vagina (birth canal). If leaking membranes are suspected, take your temperature and tell your caregiver of this when you call.  There is vaginal spotting, bleeding, or passing clots. Tell your caregiver of the amount and how many pads are used. Light spotting in pregnancy is common, especially following intercourse.  You develop a bad smelling vaginal discharge with a change in the color from clear   to white.  You continue to feel sick to your stomach (nauseated) and have no relief from remedies suggested. You vomit blood or coffee ground-like materials.  You lose more than 2 pounds of weight or gain more than 2 pounds of weight over 1 week, or as suggested by your caregiver.  You notice swelling of your face, hands, feet, or legs.  You get exposed to German measles and have never had them.  You are exposed to fifth disease or chickenpox.  You develop belly (abdominal) pain. Round ligament discomfort is a common non-cancerous (benign) cause of abdominal pain in pregnancy. Your caregiver still must evaluate you.  You develop a bad headache that does not go away.  You develop fever, diarrhea, pain with urination, or shortness of breath.  You develop visual problems, blurry, or double vision.  You fall or are in a car accident or any kind of trauma.  There is mental or physical violence at home. Document Released: 10/06/2001 Document Revised: 01/04/2012 Document Reviewed: 04/10/2009 [employer] Patient Information 2013 [employer].  

## 2012-09-05 ENCOUNTER — Telehealth: Payer: Self-pay | Admitting: *Deleted

## 2012-09-05 DIAGNOSIS — O99345 Other mental disorders complicating the puerperium: Secondary | ICD-10-CM

## 2012-09-05 DIAGNOSIS — F53 Postpartum depression: Secondary | ICD-10-CM

## 2012-09-05 MED ORDER — FLUOXETINE HCL 20 MG PO CAPS: 20.0000 mg | ORAL_CAPSULE | Freq: Every day | ORAL | Status: AC

## 2012-09-05 MED ORDER — FLUOXETINE HCL 20 MG PO CAPS: 20.0000 mg | ORAL_CAPSULE | Freq: Every day | ORAL | Status: DC

## 2012-09-05 NOTE — Telephone Encounter (Signed)
Patient has been unable to refill her prozac and would like it called back in.

## 2012-09-27 ENCOUNTER — Ambulatory Visit (INDEPENDENT_AMBULATORY_CARE_PROVIDER_SITE_OTHER): Payer: Medicaid Other | Admitting: Family Medicine

## 2012-09-27 ENCOUNTER — Encounter: Payer: Self-pay | Admitting: Family Medicine

## 2012-09-27 VITALS — BP 101/70 | Wt 239.0 lb

## 2012-09-27 DIAGNOSIS — Z23 Encounter for immunization: Secondary | ICD-10-CM

## 2012-09-27 DIAGNOSIS — Z3480 Encounter for supervision of other normal pregnancy, unspecified trimester: Secondary | ICD-10-CM

## 2012-09-27 MED ORDER — TETANUS-DIPHTH-ACELL PERTUSSIS 5-2.5-18.5 LF-MCG/0.5 IM SUSP: 0.5000 mL | Freq: Once | INTRAMUSCULAR | Status: DC

## 2012-09-27 NOTE — Progress Notes (Signed)
On OC's when pregnant x many weeks.  Dating is way off--adjusted to U/S date. Needs TDAP and 1 hour--done today.

## 2012-09-27 NOTE — Patient Instructions (Addendum)
Pregnancy - Third Trimester The third trimester of pregnancy (the last 3 months) is a period of the most rapid growth for you and your baby. The baby approaches a length of 20 inches and a weight of 6 to 10 pounds. The baby is adding on fat and getting ready for life outside your body. While inside, babies have periods of sleeping and waking, suck their thumbs, and hiccups. You can often feel small contractions of the uterus. This is false labor. It is also called Braxton-Hicks contractions. This is like a practice for labor. The usual problems in this stage of pregnancy include more difficulty breathing, swelling of the hands and feet from water retention, and having to urinate more often because of the uterus and baby pressing on your bladder.  PRENATAL EXAMS  Blood work may continue to be done during prenatal exams. These tests are done to check on your health and the probable health of your baby. Blood work is used to follow your blood levels (hemoglobin). Anemia (low hemoglobin) is common during pregnancy. Iron and vitamins are given to help prevent this. You may also continue to be checked for diabetes. Some of the past blood tests may be done again.  The size of the uterus is measured during each visit. This makes sure your baby is growing properly according to your pregnancy dates.  Your blood pressure is checked every prenatal visit. This is to make sure you are not getting toxemia.  Your urine is checked every prenatal visit for infection, diabetes and protein.  Your weight is checked at each visit. This is done to make sure gains are happening at the suggested rate and that you and your baby are growing normally.  Sometimes, an ultrasound is performed to confirm the position and the proper growth and development of the baby. This is a test done that bounces harmless sound waves off the baby so your caregiver can more accurately determine due dates.  Discuss the type of pain medication  and anesthesia you will have during your labor and delivery.  Discuss the possibility and anesthesia if a Cesarean Section might be necessary.  Inform your caregiver if there is any mental or physical violence at home. Sometimes, a specialized non-stress test, contraction stress test and biophysical profile are done to make sure the baby is not having a problem. Checking the amniotic fluid surrounding the baby is called an amniocentesis. The amniotic fluid is removed by sticking a needle into the belly (abdomen). This is sometimes done near the end of pregnancy if an early delivery is required. In this case, it is done to help make sure the baby's lungs are mature enough for the baby to live outside of the womb. If the lungs are not mature and it is unsafe to deliver the baby, an injection of cortisone medication is given to the mother 1 to 2 days before the delivery. This helps the baby's lungs mature and makes it safer to deliver the baby. CHANGES OCCURING IN THE THIRD TRIMESTER OF PREGNANCY Your body goes through many changes during pregnancy. They vary from person to person. Talk to your caregiver about changes you notice and are concerned about.  During the last trimester, you have probably had an increase in your appetite. It is normal to have cravings for certain foods. This varies from person to person and pregnancy to pregnancy.  You may begin to get stretch marks on your hips, abdomen, and breasts. These are normal changes in the body  during pregnancy. There are no exercises or medications to take which prevent this change.  Constipation may be treated with a stool softener or adding bulk to your diet. Drinking lots of fluids, fiber in vegetables, fruits, and whole grains are helpful.  Exercising is also helpful. If you have been very active up until your pregnancy, most of these activities can be continued during your pregnancy. If you have been less active, it is helpful to start an  exercise program such as walking. Consult your caregiver before starting exercise programs.  Avoid all smoking, alcohol, un-prescribed drugs, herbs and "street drugs" during your pregnancy. These chemicals affect the formation and growth of the baby. Avoid chemicals throughout the pregnancy to ensure the delivery of a healthy infant.  Backache, varicose veins and hemorrhoids may develop or get worse.  You will tire more easily in the third trimester, which is normal.  The baby's movements may be stronger and more often.  You may become short of breath easily.  Your belly button may stick out.  A yellow discharge may leak from your breasts called colostrum.  You may have a bloody mucus discharge. This usually occurs a few days to a week before labor begins. HOME CARE INSTRUCTIONS   Keep your caregiver's appointments. Follow your caregiver's instructions regarding medication use, exercise, and diet.  During pregnancy, you are providing food for you and your baby. Continue to eat regular, well-balanced meals. Choose foods such as meat, fish, milk and other low fat dairy products, vegetables, fruits, and whole-grain breads and cereals. Your caregiver will tell you of the ideal weight gain.  A physical sexual relationship may be continued throughout pregnancy if there are no other problems such as early (premature) leaking of amniotic fluid from the membranes, vaginal bleeding, or belly (abdominal) pain.  Exercise regularly if there are no restrictions. Check with your caregiver if you are unsure of the safety of your exercises. Greater weight gain will occur in the last 2 trimesters of pregnancy. Exercising helps:  Control your weight.  Get you in shape for labor and delivery.  You lose weight after you deliver.  Rest a lot with legs elevated, or as needed for leg cramps or low back pain.  Wear a good support or jogging bra for breast tenderness during pregnancy. This may help if worn  during sleep. Pads or tissues may be used in the bra if you are leaking colostrum.  Do not use hot tubs, steam rooms, or saunas.  Wear your seat belt when driving. This protects you and your baby if you are in an accident.  Avoid raw meat, cat litter boxes and soil used by cats. These carry germs that can cause birth defects in the baby.  It is easier to loose urine during pregnancy. Tightening up and strengthening the pelvic muscles will help with this problem. You can practice stopping your urination while you are going to the bathroom. These are the same muscles you need to strengthen. It is also the muscles you would use if you were trying to stop from passing gas. You can practice tightening these muscles up 10 times a set and repeating this about 3 times per day. Once you know what muscles to tighten up, do not perform these exercises during urination. It is more likely to cause an infection by backing up the urine.  Ask for help if you have financial, counseling or nutritional needs during pregnancy. Your caregiver will be able to offer counseling for these  needs as well as refer you for other special needs.  Make a list of emergency phone numbers and have them available.  Plan on getting help from family or friends when you go home from the hospital.  Make a trial run to the hospital.  Take prenatal classes with the father to understand, practice and ask questions about the labor and delivery.  Prepare the baby's room/nursery.  Do not travel out of the city unless it is absolutely necessary and with the advice of your caregiver.  Wear only low or no heal shoes to have better balance and prevent falling. MEDICATIONS AND DRUG USE IN PREGNANCY  Take prenatal vitamins as directed. The vitamin should contain 1 milligram of folic acid. Keep all vitamins out of reach of children. Only a couple vitamins or tablets containing iron may be fatal to a baby or young child when  ingested.  Avoid use of all medications, including herbs, over-the-counter medications, not prescribed or suggested by your caregiver. Only take over-the-counter or prescription medicines for pain, discomfort, or fever as directed by your caregiver. Do not use aspirin, ibuprofen (Motrin, Advil, Nuprin) or naproxen (Aleve) unless OK'd by your caregiver.  Let your caregiver also know about herbs you may be using.  Alcohol is related to a number of birth defects. This includes fetal alcohol syndrome. All alcohol, in any form, should be avoided completely. Smoking will cause low birth rate and premature babies.  Street/illegal drugs are very harmful to the baby. They are absolutely forbidden. A baby born to an addicted mother will be addicted at birth. The baby will go through the same withdrawal an adult does. SEEK MEDICAL CARE IF: You have any concerns or worries during your pregnancy. It is better to call with your questions if you feel they cannot wait, rather than worry about them. DECISIONS ABOUT CIRCUMCISION You may or may not know the sex of your baby. If you know your baby is a boy, it may be time to think about circumcision. Circumcision is the removal of the foreskin of the penis. This is the skin that covers the sensitive end of the penis. There is no proven medical need for this. Often this decision is made on what is popular at the time or based upon religious beliefs and social issues. You can discuss these issues with your caregiver or pediatrician. SEEK IMMEDIATE MEDICAL CARE IF:   An unexplained oral temperature above 102 F (38.9 C) develops, or as your caregiver suggests.  You have leaking of fluid from the vagina (birth canal). If leaking membranes are suspected, take your temperature and tell your caregiver of this when you call.  There is vaginal spotting, bleeding or passing clots. Tell your caregiver of the amount and how many pads are used.  You develop a bad smelling  vaginal discharge with a change in the color from clear to white.  You develop vomiting that lasts more than 24 hours.  You develop chills or fever.  You develop shortness of breath.  You develop burning on urination.  You loose more than 2 pounds of weight or gain more than 2 pounds of weight or as suggested by your caregiver.  You notice sudden swelling of your face, hands, and feet or legs.  You develop belly (abdominal) pain. Round ligament discomfort is a common non-cancerous (benign) cause of abdominal pain in pregnancy. Your caregiver still must evaluate you.  You develop a severe headache that does not go away.  You develop visual  problems, blurred or double vision.  If you have not felt your baby move for more than 1 hour. If you think the baby is not moving as much as usual, eat something with sugar in it and lie down on your left side for an hour. The baby should move at least 4 to 5 times per hour. Call right away if your baby moves less than that.  You fall, are in a car accident or any kind of trauma.  There is mental or physical violence at home. Document Released: 10/06/2001 Document Revised: 01/04/2012 Document Reviewed: 04/10/2009 [facility] Patient Information 2013 [place], [place].  Breastfeeding Deciding to breastfeed is one of the best choices you can make for you and your baby. The information that follows gives a brief overview of the benefits of breastfeeding as well as common topics surrounding breastfeeding. BENEFITS OF BREASTFEEDING For the baby  The first milk (colostrum) helps the baby's digestive system function better.   There are antibodies in the mother's milk that help the baby fight off infections.   The baby has a lower incidence of asthma, allergies, and sudden infant death syndrome (SIDS).   The nutrients in breast milk are better for the baby than infant formulas, and breast milk helps the baby's brain grow better.   Babies who  breastfeed have less gas, colic, and constipation.  For the mother  Breastfeeding helps develop a very special bond between the mother and her baby.   Breastfeeding is convenient, always available at the correct temperature, and costs nothing.   Breastfeeding burns calories in the mother and helps her lose weight that was gained during pregnancy.   Breastfeeding makes the uterus contract back down to normal size faster and slows bleeding following delivery.   Breastfeeding mothers have a lower risk of developing breast cancer.  BREASTFEEDING FREQUENCY  A healthy, full-term baby may breastfeed as often as every hour or space his or her feedings to every 3 hours.   Watch your baby for signs of hunger. Nurse your baby if he or she shows signs of hunger. How often you nurse will vary from baby to baby.   Nurse as often as the baby requests, or when you feel the need to reduce the fullness of your breasts.   Awaken the baby if it has been 3 4 hours since the last feeding.   Frequent feeding will help the mother make more milk and will help prevent problems, such as sore nipples and engorgement of the breasts.  BABY'S POSITION AT THE BREAST  Whether lying down or sitting, be sure that the baby's tummy is facing your tummy.   Support the breast with 4 fingers underneath the breast and the thumb above. Make sure your fingers are well away from the nipple and baby's mouth.   Stroke the baby's lips gently with your finger or nipple.   When the baby's mouth is open wide enough, place all of your nipple and as much of the areola as possible into your baby's mouth.   Pull the baby in close so the tip of the nose and the baby's cheeks touch the breast during the feeding.  FEEDINGS AND SUCTION  The length of each feeding varies from baby to baby and from feeding to feeding.   The baby must suck about 2 3 minutes for your milk to get to him or her. This is called a "let down."  For this reason, allow the baby to feed on each breast as  long as he or she wants. Your baby will end the feeding when he or she has received the right balance of nutrients.   To break the suction, put your finger into the corner of the baby's mouth and slide it between his or her gums before removing your breast from his or her mouth. This will help prevent sore nipples.  HOW TO TELL WHETHER YOUR BABY IS GETTING ENOUGH BREAST MILK. Wondering whether or not your baby is getting enough milk is a common concern among mothers. You can be assured that your baby is getting enough milk if:   Your baby is actively sucking and you hear swallowing.   Your baby seems relaxed and satisfied after a feeding.   Your baby nurses at least 8 12 times in a 24 hour time period. Nurse your baby until he or she unlatches or falls asleep at the first breast (at least 10 20 minutes), then offer the second side.   Your baby is wetting 5 6 disposable diapers (6 8 cloth diapers) in a 24 hour period by 66 54 days of age.   Your baby is having at least 3 4 stools every 24 hours for the first 6 weeks. The stool should be soft and yellow.   Your baby should gain 4 7 ounces per week after he or she is 64 days old.   Your breasts feel softer after nursing.  REDUCING BREAST ENGORGEMENT  In the first week after your baby is born, you may experience signs of breast engorgement. When breasts are engorged, they feel heavy, warm, full, and may be tender to the touch. You can reduce engorgement if you:   Nurse frequently, every 2 3 hours. Mothers who breastfeed early and often have fewer problems with engorgement.   Place light ice packs on your breasts for 10 20 minutes between feedings. This reduces swelling. Wrap the ice packs in a lightweight towel to protect your skin. Bags of frozen vegetables work well for this purpose.   Take a warm shower or apply warm, moist heat to your breast for 5 10 minutes just before  each feeding. This increases circulation and helps the milk flow.   Gently massage your breast before and during the feeding. Using your finger tips, massage from the chest wall towards your nipple in a circular motion.   Make sure that the baby empties at least one breast at every feeding before switching sides.   Use a breast pump to empty the breasts if your baby is sleepy or not nursing well. You may also want to pump if you are returning to work oryou feel you are getting engorged.   Avoid bottle feeds, pacifiers, or supplemental feedings of water or juice in place of breastfeeding. Breast milk is all the food your baby needs. It is not necessary for your baby to have water or formula. In fact, to help your breasts make more milk, it is best not to give your baby supplemental feedings during the early weeks.   Be sure the baby is latched on and positioned properly while breastfeeding.   Wear a supportive bra, avoiding underwire styles.   Eat a balanced diet with enough fluids.   Rest often, relax, and take your prenatal vitamins to prevent fatigue, stress, and anemia.  If you follow these suggestions, your engorgement should improve in 24 48 hours. If you are still experiencing difficulty, call your lactation consultant or caregiver.  CARING FOR YOURSELF Take care of your  breasts  Bathe or shower daily.   Avoid using soap on your nipples.   Start feedings on your left breast at one feeding and on your right breast at the next feeding.   You will notice an increase in your milk supply 2 5 days after delivery. You may feel some discomfort from engorgement, which makes your breasts very firm and often tender. Engorgement "peaks" out within 24 48 hours. In the meantime, apply warm moist towels to your breasts for 5 10 minutes before feeding. Gentle massage and expression of some milk before feeding will soften your breasts, making it easier for your baby to latch on.    Wear a well-fitting nursing bra, and air dry your nipples for a 3 after each feeding.   Only use cotton bra pads.   Only use pure lanolin on your nipples after nursing. You do not need to wash it off before feeding the baby again. Another option is to express a few drops of breast milk and gently massage it into your nipples.  Take care of yourself  Eat well-balanced meals and nutritious snacks.   Drinking milk, fruit juice, and water to satisfy your thirst (about 8 glasses a day).   Get plenty of rest.  Avoid foods that you notice affect the baby in a bad way.  SEEK MEDICAL CARE IF:   You have difficulty with breastfeeding and need help.   You have a hard, red, sore area on your breast that is accompanied by a fever.   Your baby is too sleepy to eat well or is having trouble sleeping.   Your baby is wetting less than 6 diapers a day, by 58 days of age.   Your baby's skin or white part of his or her eyes is more yellow than it was in the hospital.   You feel depressed.  Document Released: 10/12/2005 Document Revised: 04/12/2012 Document Reviewed: 01/10/2012 [facility] Patient Information 2013 [place], [place]. Levonorgestrel intrauterine device (IUD) What is this medicine? LEVONORGESTREL IUD (LEE voe nor jes trel) is a contraceptive (birth control) device. The device is placed inside the uterus by a healthcare professional. It is used to prevent pregnancy and can also be used to treat heavy bleeding that occurs during your period. Depending on the device, it can be used for 3 to 5 years. This medicine may be used for other purposes; ask your health care provider or pharmacist if you have questions. What should I tell my health care provider before I take this medicine? They need to know if you have any of these conditions: -abnormal Pap smear -cancer of the breast, uterus, or cervix -diabetes -endometritis -genital or pelvic infection now or in the  past -have more than one sexual partner or your partner has more than one partner -heart disease -history of an ectopic or tubal pregnancy -immune system problems -IUD in place -liver disease or tumor -problems with blood clots or take blood-thinners -use intravenous drugs -uterus of unusual shape -vaginal bleeding that has not been explained -an unusual or allergic reaction to levonorgestrel, other hormones, silicone, or polyethylene, medicines, foods, dyes, or preservatives -pregnant or trying to get pregnant -breast-feeding How should I use this medicine? This device is placed inside the uterus by a health care professional. Talk to your pediatrician regarding the use of this medicine in children. Special care may be needed. Overdosage: If you think you have taken too much of this medicine contact a poison control center or emergency room at  once. NOTE: This medicine is only for you. Do not share this medicine with others. What if I miss a dose? This does not apply. What may interact with this medicine? Do not take this medicine with any of the following medications: -amprenavir -bosentan -fosamprenavir This medicine may also interact with the following medications: -aprepitant -barbiturate medicines for inducing sleep or treating seizures -bexarotene -griseofulvin -medicines to treat seizures like carbamazepine, ethotoin, felbamate, oxcarbazepine, phenytoin, topiramate -modafinil -pioglitazone -rifabutin -rifampin -rifapentine -some medicines to treat HIV infection like atazanavir, indinavir, lopinavir, nelfinavir, tipranavir, ritonavir -St. John's wort -warfarin This list may not describe all possible interactions. Give your health care provider a list of all the medicines, herbs, non-prescription drugs, or dietary supplements you use. Also tell them if you smoke, drink alcohol, or use illegal drugs. Some items may interact with your medicine. What should I watch for  while using this medicine? Visit your doctor or health care professional for regular check ups. See your doctor if you or your partner has sexual contact with others, becomes HIV positive, or gets a sexual transmitted disease. This product does not protect you against HIV infection (AIDS) or other sexually transmitted diseases. You can check the placement of the IUD yourself by reaching up to the top of your vagina with clean fingers to feel the threads. Do not pull on the threads. It is a good habit to check placement after each menstrual period. Call your doctor right away if you feel more of the IUD than just the threads or if you cannot feel the threads at all. The IUD may come out by itself. You may become pregnant if the device comes out. If you notice that the IUD has come out use a backup birth control method like condoms and call your health care provider. Using tampons will not change the position of the IUD and are okay to use during your period. What side effects may I notice from receiving this medicine? Side effects that you should report to your doctor or health care professional as soon as possible: -allergic reactions like skin rash, itching or hives, swelling of the face, lips, or tongue -fever, flu-like symptoms -genital sores -high blood pressure -no menstrual period for 6 weeks during use -pain, swelling, warmth in the leg -pelvic pain or tenderness -severe or sudden headache -signs of pregnancy -stomach cramping -sudden shortness of breath -trouble with balance, talking, or walking -unusual vaginal bleeding, discharge -yellowing of the eyes or skin Side effects that usually do not require medical attention (report to your doctor or health care professional if they continue or are bothersome): -acne -breast pain -change in sex drive or performance -changes in weight -cramping, dizziness, or faintness while the device is being inserted -headache -irregular menstrual  bleeding within first 3 to 6 months of use -nausea This list may not describe all possible side effects. Call your doctor for medical advice about side effects. You may report side effects to FDA at 1-800-FDA-1088. Where should I keep my medicine? This does not apply. NOTE: This sheet is a summary. It may not cover all possible information. If you have questions about this medicine, talk to your doctor, pharmacist, or health care provider.  2013, [employer]. (11/12/2011 1:54:04 PM)

## 2012-09-27 NOTE — Progress Notes (Signed)
Would like quad screen today.

## 2012-09-28 ENCOUNTER — Encounter: Payer: Self-pay | Admitting: Family Medicine

## 2012-09-28 LAB — GLUCOSE TOLERANCE, 1 HOUR (50G) W/O FASTING: Glucose, 1 Hour GTT: 141 mg/dL — ABNORMAL HIGH (ref 70–140)

## 2012-10-17 ENCOUNTER — Ambulatory Visit (INDEPENDENT_AMBULATORY_CARE_PROVIDER_SITE_OTHER): Payer: Medicaid Other | Admitting: Family Medicine

## 2012-10-17 ENCOUNTER — Encounter: Payer: Self-pay | Admitting: Family Medicine

## 2012-10-17 VITALS — BP 103/65 | Wt 245.0 lb

## 2012-10-17 DIAGNOSIS — K649 Unspecified hemorrhoids: Secondary | ICD-10-CM

## 2012-10-17 DIAGNOSIS — O2243 Hemorrhoids in pregnancy, third trimester: Secondary | ICD-10-CM

## 2012-10-17 DIAGNOSIS — Z3480 Encounter for supervision of other normal pregnancy, unspecified trimester: Secondary | ICD-10-CM

## 2012-10-17 DIAGNOSIS — O228X9 Other venous complications in pregnancy, unspecified trimester: Secondary | ICD-10-CM

## 2012-10-17 MED ORDER — HYDROCORTISONE 2.5 % RE CREA: TOPICAL_CREAM | Freq: Two times a day (BID) | RECTAL | Status: DC

## 2012-10-17 NOTE — Patient Instructions (Addendum)
Hemorrhoids Hemorrhoids are enlarged (dilated) veins around the rectum. There are 2 types of hemorrhoids, and the type of hemorrhoid is determined by its location. Internal hemorrhoids occur in the veins just inside the rectum.They are usually not painful, but they may bleed.However, they may poke through to the outside and become irritated and painful. External hemorrhoids involve the veins outside the anus and can be felt as a painful swelling or hard lump near the anus.They are often itchy and may crack and bleed. Sometimes clots will form in the veins. This makes them swollen and painful. These are called thrombosed hemorrhoids. CAUSES Causes of hemorrhoids include:  Pregnancy. This increases the pressure in the hemorrhoidal veins.  Constipation.  Straining to have a bowel movement.  Obesity.  Heavy lifting or other activity that caused you to strain. TREATMENT Most of the time hemorrhoids improve in 1 to 2 weeks. However, if symptoms do not seem to be getting better or if you have a lot of rectal bleeding, your caregiver may perform a procedure to help make the hemorrhoids get smaller or remove them completely.Possible treatments include:  Rubber band ligation. A rubber band is placed at the base of the hemorrhoid to cut off the circulation.  Sclerotherapy. A chemical is injected to shrink the hemorrhoid.  Infrared light therapy. Tools are used to burn the hemorrhoid.  Hemorrhoidectomy. This is surgical removal of the hemorrhoid. HOME CARE INSTRUCTIONS   Increase fiber in your diet. Ask your caregiver about using fiber supplements.  Drink enough water and fluids to keep your urine clear or pale yellow.  Exercise regularly.  Go to the bathroom when you have the urge to have a bowel movement. Do not wait.  Avoid straining to have bowel movements.  Keep the anal area dry and clean.  Only take over-the-counter or prescription medicines for pain, discomfort, or fever as  directed by your caregiver. If your hemorrhoids are thrombosed:  Take warm sitz baths for 20 to 30 minutes, 3 to 4 times per day.  If the hemorrhoids are very tender and swollen, place ice packs on the area as tolerated. Using ice packs between sitz baths may be helpful. Fill a plastic bag with ice. Place a towel between the bag of ice and your skin.  Medicated creams and suppositories may be used or applied as directed.  Do not use a donut-shaped pillow or sit on the toilet for long periods. This increases blood pooling and pain. SEEK MEDICAL CARE IF:   You have increasing pain and swelling that is not controlled with your medicine.  You have uncontrolled bleeding.  You have difficulty or you are unable to have a bowel movement.  You have pain or inflammation outside the area of the hemorrhoids.  You have chills or an oral temperature above 102 F (38.9 C). MAKE SURE YOU:   Understand these instructions.  Will watch your condition.  Will get help right away if you are not doing well or get worse. Document Released: 10/09/2000 Document Revised: 01/04/2012 Document Reviewed: 09/22/2010 [facility] Patient Information 2013 [place], [place].  Breastfeeding Deciding to breastfeed is one of the best choices you can make for you and your baby. The information that follows gives a brief overview of the benefits of breastfeeding as well as common topics surrounding breastfeeding. BENEFITS OF BREASTFEEDING For the baby  The first milk (colostrum) helps the baby's digestive system function better.   There are antibodies in the mother's milk that help the baby fight off infections.  The baby has a lower incidence of asthma, allergies, and sudden infant death syndrome (SIDS).   The nutrients in breast milk are better for the baby than infant formulas, and breast milk helps the baby's brain grow better.   Babies who breastfeed have less gas, colic, and constipation.  For the  mother  Breastfeeding helps develop a very special bond between the mother and her baby.   Breastfeeding is convenient, always available at the correct temperature, and costs nothing.   Breastfeeding burns calories in the mother and helps her lose weight that was gained during pregnancy.   Breastfeeding makes the uterus contract back down to normal size faster and slows bleeding following delivery.   Breastfeeding mothers have a lower risk of developing breast cancer.  BREASTFEEDING FREQUENCY  A healthy, full-term baby may breastfeed as often as every hour or space his or her feedings to every 3 hours.   Watch your baby for signs of hunger. Nurse your baby if he or she shows signs of hunger. How often you nurse will vary from baby to baby.   Nurse as often as the baby requests, or when you feel the need to reduce the fullness of your breasts.   Awaken the baby if it has been 3 4 hours since the last feeding.   Frequent feeding will help the mother make more milk and will help prevent problems, such as sore nipples and engorgement of the breasts.  BABY'S POSITION AT THE BREAST  Whether lying down or sitting, be sure that the baby's tummy is facing your tummy.   Support the breast with 4 fingers underneath the breast and the thumb above. Make sure your fingers are well away from the nipple and baby's mouth.   Stroke the baby's lips gently with your finger or nipple.   When the baby's mouth is open wide enough, place all of your nipple and as much of the areola as possible into your baby's mouth.   Pull the baby in close so the tip of the nose and the baby's cheeks touch the breast during the feeding.  FEEDINGS AND SUCTION  The length of each feeding varies from baby to baby and from feeding to feeding.   The baby must suck about 2 3 minutes for your milk to get to him or her. This is called a "let down." For this reason, allow the baby to feed on each breast as long  as he or she wants. Your baby will end the feeding when he or she has received the right balance of nutrients.   To break the suction, put your finger into the corner of the baby's mouth and slide it between his or her gums before removing your breast from his or her mouth. This will help prevent sore nipples.  HOW TO TELL WHETHER YOUR BABY IS GETTING ENOUGH BREAST MILK. Wondering whether or not your baby is getting enough milk is a common concern among mothers. You can be assured that your baby is getting enough milk if:   Your baby is actively sucking and you hear swallowing.   Your baby seems relaxed and satisfied after a feeding.   Your baby nurses at least 8 12 times in a 24 hour time period. Nurse your baby until he or she unlatches or falls asleep at the first breast (at least 10 20 minutes), then offer the second side.   Your baby is wetting 5 6 disposable diapers (6 8 cloth diapers) in a  24 hour period by 3 34 days of age.   Your baby is having at least 3 4 stools every 24 hours for the first 6 weeks. The stool should be soft and yellow.   Your baby should gain 4 7 ounces per week after he or she is 11 days old.   Your breasts feel softer after nursing.  REDUCING BREAST ENGORGEMENT  In the first week after your baby is born, you may experience signs of breast engorgement. When breasts are engorged, they feel heavy, warm, full, and may be tender to the touch. You can reduce engorgement if you:   Nurse frequently, every 2 3 hours. Mothers who breastfeed early and often have fewer problems with engorgement.   Place light ice packs on your breasts for 10 20 minutes between feedings. This reduces swelling. Wrap the ice packs in a lightweight towel to protect your skin. Bags of frozen vegetables work well for this purpose.   Take a warm shower or apply warm, moist heat to your breast for 5 10 minutes just before each feeding. This increases circulation and helps the milk flow.    Gently massage your breast before and during the feeding. Using your finger tips, massage from the chest wall towards your nipple in a circular motion.   Make sure that the baby empties at least one breast at every feeding before switching sides.   Use a breast pump to empty the breasts if your baby is sleepy or not nursing well. You may also want to pump if you are returning to work oryou feel you are getting engorged.   Avoid bottle feeds, pacifiers, or supplemental feedings of water or juice in place of breastfeeding. Breast milk is all the food your baby needs. It is not necessary for your baby to have water or formula. In fact, to help your breasts make more milk, it is best not to give your baby supplemental feedings during the early weeks.   Be sure the baby is latched on and positioned properly while breastfeeding.   Wear a supportive bra, avoiding underwire styles.   Eat a balanced diet with enough fluids.   Rest often, relax, and take your prenatal vitamins to prevent fatigue, stress, and anemia.  If you follow these suggestions, your engorgement should improve in 24 48 hours. If you are still experiencing difficulty, call your lactation consultant or caregiver.  CARING FOR YOURSELF Take care of your breasts  Bathe or shower daily.   Avoid using soap on your nipples.   Start feedings on your left breast at one feeding and on your right breast at the next feeding.   You will notice an increase in your milk supply 2 5 days after delivery. You may feel some discomfort from engorgement, which makes your breasts very firm and often tender. Engorgement "peaks" out within 24 48 hours. In the meantime, apply warm moist towels to your breasts for 5 10 minutes before feeding. Gentle massage and expression of some milk before feeding will soften your breasts, making it easier for your baby to latch on.   Wear a well-fitting nursing bra, and air dry your nipples for a 3  after each feeding.   Only use cotton bra pads.   Only use pure lanolin on your nipples after nursing. You do not need to wash it off before feeding the baby again. Another option is to express a few drops of breast milk and gently massage it into your nipples.  Take care of yourself  Eat well-balanced meals and nutritious snacks.   Drinking milk, fruit juice, and water to satisfy your thirst (about 8 glasses a day).   Get plenty of rest.  Avoid foods that you notice affect the baby in a bad way.  SEEK MEDICAL CARE IF:   You have difficulty with breastfeeding and need help.   You have a hard, red, sore area on your breast that is accompanied by a fever.   Your baby is too sleepy to eat well or is having trouble sleeping.   Your baby is wetting less than 6 diapers a day, by 5 days of age.   Your baby's skin or white part of his or her eyes is more yellow than it was in the hospital.   You feel depressed.  Document Released: 10/12/2005 Document Revised: 04/12/2012 Document Reviewed: 01/10/2012 [facility] Patient Information 2013 [place], [place].

## 2012-10-17 NOTE — Progress Notes (Signed)
Failed 1 hour--needs 3 hr Anusol HC cream for hemorrhoids.

## 2012-10-26 NOTE — L&D Delivery Note (Signed)
Delivery Note At 6:07 PM a viable and healthy female was delivered via Vaginal, Spontaneous Delivery (Presentation: Right Occiput Anterior).  APGAR: 8, 9; weight pending .   Placenta status: Intact, Spontaneous.  Cord: 3 vessels with the following complications: None.   Anesthesia: Epidural  Episiotomy: None Lacerations: None Suture Repair: N/A Est. Blood Loss (mL): 400  Infant placed skin to skin with mom at delivery.  Mom to postpartum.  Baby to nursery-stable.  LEFTWICH-KIRBY, LISA 11/29/2012, 6:25 PM

## 2012-10-27 ENCOUNTER — Ambulatory Visit (INDEPENDENT_AMBULATORY_CARE_PROVIDER_SITE_OTHER): Payer: Medicaid Other | Admitting: Obstetrics & Gynecology

## 2012-10-27 VITALS — BP 105/75 | Wt 244.0 lb

## 2012-10-27 DIAGNOSIS — O2243 Hemorrhoids in pregnancy, third trimester: Secondary | ICD-10-CM

## 2012-10-27 DIAGNOSIS — Z3480 Encounter for supervision of other normal pregnancy, unspecified trimester: Secondary | ICD-10-CM

## 2012-10-27 DIAGNOSIS — O228X9 Other venous complications in pregnancy, unspecified trimester: Secondary | ICD-10-CM

## 2012-10-27 DIAGNOSIS — K649 Unspecified hemorrhoids: Secondary | ICD-10-CM

## 2012-10-27 MED ORDER — LIDOCAINE-HYDROCORTISONE ACE 3-1 % RE KIT: 1.0000 "application " | PACK | Freq: Two times a day (BID) | RECTAL | Status: DC

## 2012-10-27 NOTE — Patient Instructions (Signed)

## 2012-10-27 NOTE — Progress Notes (Signed)
Still with painful hemorrhoid. Inspected, not inflamed.

## 2012-10-31 ENCOUNTER — Encounter: Payer: Medicaid Other | Admitting: Obstetrics & Gynecology

## 2012-11-03 ENCOUNTER — Encounter: Payer: Self-pay | Admitting: Obstetrics and Gynecology

## 2012-11-03 ENCOUNTER — Ambulatory Visit (INDEPENDENT_AMBULATORY_CARE_PROVIDER_SITE_OTHER): Payer: Medicaid Other | Admitting: Obstetrics and Gynecology

## 2012-11-03 VITALS — BP 104/72 | Wt 247.0 lb

## 2012-11-03 DIAGNOSIS — F329 Major depressive disorder, single episode, unspecified: Secondary | ICD-10-CM

## 2012-11-03 DIAGNOSIS — K649 Unspecified hemorrhoids: Secondary | ICD-10-CM

## 2012-11-03 DIAGNOSIS — O2243 Hemorrhoids in pregnancy, third trimester: Secondary | ICD-10-CM

## 2012-11-03 DIAGNOSIS — O09299 Supervision of pregnancy with other poor reproductive or obstetric history, unspecified trimester: Secondary | ICD-10-CM

## 2012-11-03 DIAGNOSIS — Z8659 Personal history of other mental and behavioral disorders: Secondary | ICD-10-CM

## 2012-11-03 DIAGNOSIS — O228X9 Other venous complications in pregnancy, unspecified trimester: Secondary | ICD-10-CM

## 2012-11-03 DIAGNOSIS — R7309 Other abnormal glucose: Secondary | ICD-10-CM

## 2012-11-03 DIAGNOSIS — O99345 Other mental disorders complicating the puerperium: Secondary | ICD-10-CM

## 2012-11-03 DIAGNOSIS — Z3480 Encounter for supervision of other normal pregnancy, unspecified trimester: Secondary | ICD-10-CM

## 2012-11-03 LAB — OB RESULTS CONSOLE GBS: GBS: POSITIVE

## 2012-11-03 NOTE — Progress Notes (Signed)
Patient doing well without any complaints. FM/labor precautions reviewed. 3hr today. Cultures done.

## 2012-11-03 NOTE — Addendum Note (Signed)
Addended by: Barbara Cower on: 11/03/2012 11:41 AM   Modules accepted: Orders

## 2012-11-04 LAB — GLUCOSE TOLERANCE, 3 HOURS
Glucose Tolerance, 1 hour: 179 mg/dL (ref 70–189)
Glucose Tolerance, 2 hour: 176 mg/dL — ABNORMAL HIGH (ref 70–164)
Glucose Tolerance, Fasting: 76 mg/dL (ref 70–104)
Glucose, GTT - 3 Hour: 88 mg/dL (ref 70–144)

## 2012-11-04 LAB — GC/CHLAMYDIA PROBE AMP, GENITAL
Chlamydia, DNA Probe: NEGATIVE
GC Probe Amp, Genital: NEGATIVE

## 2012-11-07 ENCOUNTER — Encounter: Payer: Self-pay | Admitting: Obstetrics & Gynecology

## 2012-11-07 ENCOUNTER — Ambulatory Visit (INDEPENDENT_AMBULATORY_CARE_PROVIDER_SITE_OTHER): Payer: Medicaid Other | Admitting: Obstetrics & Gynecology

## 2012-11-07 ENCOUNTER — Encounter: Payer: Self-pay | Admitting: Obstetrics and Gynecology

## 2012-11-07 VITALS — BP 109/68 | Wt 245.0 lb

## 2012-11-07 DIAGNOSIS — Z3480 Encounter for supervision of other normal pregnancy, unspecified trimester: Secondary | ICD-10-CM

## 2012-11-07 NOTE — Progress Notes (Signed)
Routine visit. Good FM. No OB problems. Her FOB asked if they could deliver at [facility] because it is closer to their home. I recommended driving to [facility] but also told him that they could deliver at the hospital of their choice. Labor precautions reviewed.

## 2012-11-08 LAB — CULTURE, BETA STREP (GROUP B ONLY)

## 2012-11-14 ENCOUNTER — Encounter: Payer: Self-pay | Admitting: Obstetrics and Gynecology

## 2012-11-14 ENCOUNTER — Ambulatory Visit (INDEPENDENT_AMBULATORY_CARE_PROVIDER_SITE_OTHER): Payer: Medicaid Other | Admitting: Obstetrics and Gynecology

## 2012-11-14 VITALS — BP 108/80 | Wt 250.0 lb

## 2012-11-14 DIAGNOSIS — K649 Unspecified hemorrhoids: Secondary | ICD-10-CM

## 2012-11-14 DIAGNOSIS — Z302 Encounter for sterilization: Secondary | ICD-10-CM

## 2012-11-14 DIAGNOSIS — O2243 Hemorrhoids in pregnancy, third trimester: Secondary | ICD-10-CM

## 2012-11-14 DIAGNOSIS — F53 Postpartum depression: Secondary | ICD-10-CM

## 2012-11-14 DIAGNOSIS — O99345 Other mental disorders complicating the puerperium: Secondary | ICD-10-CM

## 2012-11-14 DIAGNOSIS — Z3480 Encounter for supervision of other normal pregnancy, unspecified trimester: Secondary | ICD-10-CM

## 2012-11-14 DIAGNOSIS — F329 Major depressive disorder, single episode, unspecified: Secondary | ICD-10-CM

## 2012-11-14 DIAGNOSIS — O09299 Supervision of pregnancy with other poor reproductive or obstetric history, unspecified trimester: Secondary | ICD-10-CM

## 2012-11-14 DIAGNOSIS — Z8659 Personal history of other mental and behavioral disorders: Secondary | ICD-10-CM

## 2012-11-14 DIAGNOSIS — O228X9 Other venous complications in pregnancy, unspecified trimester: Secondary | ICD-10-CM

## 2012-11-14 NOTE — Progress Notes (Signed)
Patient doing well without complaints. FM/labor precautions reviewed 

## 2012-11-21 ENCOUNTER — Encounter: Payer: Self-pay | Admitting: Family Medicine

## 2012-11-21 ENCOUNTER — Ambulatory Visit (INDEPENDENT_AMBULATORY_CARE_PROVIDER_SITE_OTHER): Payer: Medicaid Other | Admitting: Family Medicine

## 2012-11-21 VITALS — BP 115/71 | Wt 253.0 lb

## 2012-11-21 DIAGNOSIS — Z3480 Encounter for supervision of other normal pregnancy, unspecified trimester: Secondary | ICD-10-CM

## 2012-11-21 DIAGNOSIS — N898 Other specified noninflammatory disorders of vagina: Secondary | ICD-10-CM

## 2012-11-21 DIAGNOSIS — O26899 Other specified pregnancy related conditions, unspecified trimester: Secondary | ICD-10-CM

## 2012-11-21 MED ORDER — FLUCONAZOLE 150 MG PO TABS: 150.0000 mg | ORAL_TABLET | Freq: Every day | ORAL | Status: DC

## 2012-11-21 NOTE — Patient Instructions (Addendum)
Normal Labor and Delivery Your caregiver must first be sure you are in labor. Signs of labor include:  You may pass what is called "the mucus plug" before labor begins. This is a small amount of blood stained mucus.  Regular uterine contractions.  The time between contractions get closer together.  The discomfort and pain gradually gets more intense.  Pains are mostly located in the back.  Pains get worse when walking.  The cervix (the opening of the uterus becomes thinner (begins to efface) and opens up (dilates). Once you are in labor and admitted into the hospital or care center, your caregiver will do the following:  A complete physical examination.  Check your vital signs (blood pressure, pulse, temperature and the fetal heart rate).  Do a vaginal examination (using a sterile glove and lubricant) to determine:  The position (presentation) of the baby (head [vertex] or buttock first).  The level (station) of the baby's head in the birth canal.  The effacement and dilatation of the cervix.  You may have your pubic hair shaved and be given an enema depending on your caregiver and the circumstance.  An electronic monitor is usually placed on your abdomen. The monitor follows the length and intensity of the contractions, as well as the baby's heart rate.  Usually, your caregiver will insert an IV in your arm with a bottle of sugar water. This is done as a precaution so that medications can be given to you quickly during labor or delivery. NORMAL LABOR AND DELIVERY IS DIVIDED UP INTO 3 STAGES: First Stage This is when regular contractions begin and the cervix begins to efface and dilate. This stage can last from 3 to 15 hours. The end of the first stage is when the cervix is 100% effaced and 10 centimeters dilated. Pain medications may be given by   Injection (morphine, demerol, etc.)  Regional anesthesia (spinal, caudal or epidural, anesthetics given in different locations of  the spine). Paracervical pain medication may be given, which is an injection of and anesthetic on each side of the cervix. A pregnant woman may request to have "Natural Childbirth" which is not to have any medications or anesthesia during her labor and delivery. Second Stage This is when the baby comes down through the birth canal (vagina) and is born. This can take 1 to 4 hours. As the baby's head comes down through the birth canal, you may feel like you are going to have a bowel movement. You will get the urge to bear down and push until the baby is delivered. As the baby's head is being delivered, the caregiver will decide if an episiotomy (a cut in the perineum and vagina area) is needed to prevent tearing of the tissue in this area. The episiotomy is sewn up after the delivery of the baby and placenta. Sometimes a mask with nitrous oxide is given for the mother to breath during the delivery of the baby to help if there is too much pain. The end of Stage 2 is when the baby is fully delivered. Then when the umbilical cord stops pulsating it is clamped and cut. Third Stage The third stage begins after the baby is completely delivered and ends after the placenta (afterbirth) is delivered. This usually takes 5 to 30 minutes. After the placenta is delivered, a medication is given either by intravenous or injection to help contract the uterus and prevent bleeding. The third stage is not painful and pain medication is usually not necessary.   If an episiotomy was done, it is repaired at this time. After the delivery, the mother is watched and monitored closely for 1 to 2 hours to make sure there is no postpartum bleeding (hemorrhage). If there is a lot of bleeding, medication is given to contract the uterus and stop the bleeding. Document Released: 07/21/2008 Document Revised: 01/04/2012 Document Reviewed: 07/21/2008 [employer] Patient Information 2013 [employer].  Breastfeeding Deciding to breastfeed is  one of the best choices you can make for you and your baby. The information that follows gives a brief overview of the benefits of breastfeeding as well as common topics surrounding breastfeeding. BENEFITS OF BREASTFEEDING For the baby  The first milk (colostrum) helps the baby's digestive system function better.   There are antibodies in the mother's milk that help the baby fight off infections.   The baby has a lower incidence of asthma, allergies, and sudden infant death syndrome (SIDS).   The nutrients in breast milk are better for the baby than infant formulas, and breast milk helps the baby's brain grow better.   Babies who breastfeed have less gas, colic, and constipation.  For the mother  Breastfeeding helps develop a very special bond between the mother and her baby.   Breastfeeding is convenient, always available at the correct temperature, and costs nothing.   Breastfeeding burns calories in the mother and helps her lose weight that was gained during pregnancy.   Breastfeeding makes the uterus contract back down to normal size faster and slows bleeding following delivery.   Breastfeeding mothers have a lower risk of developing breast cancer.  BREASTFEEDING FREQUENCY  A healthy, full-term baby may breastfeed as often as every hour or space his or her feedings to every 3 hours.   Watch your baby for signs of hunger. Nurse your baby if he or she shows signs of hunger. How often you nurse will vary from baby to baby.   Nurse as often as the baby requests, or when you feel the need to reduce the fullness of your breasts.   Awaken the baby if it has been 3 4 hours since the last feeding.   Frequent feeding will help the mother make more milk and will help prevent problems, such as sore nipples and engorgement of the breasts.  BABY'S POSITION AT THE BREAST  Whether lying down or sitting, be sure that the baby's tummy is facing your tummy.   Support the  breast with 4 fingers underneath the breast and the thumb above. Make sure your fingers are well away from the nipple and baby's mouth.   Stroke the baby's lips gently with your finger or nipple.   When the baby's mouth is open wide enough, place all of your nipple and as much of the areola as possible into your baby's mouth.   Pull the baby in close so the tip of the nose and the baby's cheeks touch the breast during the feeding.  FEEDINGS AND SUCTION  The length of each feeding varies from baby to baby and from feeding to feeding.   The baby must suck about 2 3 minutes for your milk to get to him or her. This is called a "let down." For this reason, allow the baby to feed on each breast as long as he or she wants. Your baby will end the feeding when he or she has received the right balance of nutrients.   To break the suction, put your finger into the corner of the baby's   mouth and slide it between his or her gums before removing your breast from his or her mouth. This will help prevent sore nipples.  HOW TO TELL WHETHER YOUR BABY IS GETTING ENOUGH BREAST MILK. Wondering whether or not your baby is getting enough milk is a common concern among mothers. You can be assured that your baby is getting enough milk if:   Your baby is actively sucking and you hear swallowing.   Your baby seems relaxed and satisfied after a feeding.   Your baby nurses at least 8 12 times in a 24 hour time period. Nurse your baby until he or she unlatches or falls asleep at the first breast (at least 10 20 minutes), then offer the second side.   Your baby is wetting 5 6 disposable diapers (6 8 cloth diapers) in a 24 hour period by 5 6 days of age.   Your baby is having at least 3 4 stools every 24 hours for the first 6 weeks. The stool should be soft and yellow.   Your baby should gain 4 7 ounces per week after he or she is 4 days old.   Your breasts feel softer after nursing.  REDUCING BREAST  ENGORGEMENT  In the first week after your baby is born, you may experience signs of breast engorgement. When breasts are engorged, they feel heavy, warm, full, and may be tender to the touch. You can reduce engorgement if you:   Nurse frequently, every 2 3 hours. Mothers who breastfeed early and often have fewer problems with engorgement.   Place light ice packs on your breasts for 10 20 minutes between feedings. This reduces swelling. Wrap the ice packs in a lightweight towel to protect your skin. Bags of frozen vegetables work well for this purpose.   Take a warm shower or apply warm, moist heat to your breast for 5 10 minutes just before each feeding. This increases circulation and helps the milk flow.   Gently massage your breast before and during the feeding. Using your finger tips, massage from the chest wall towards your nipple in a circular motion.   Make sure that the baby empties at least one breast at every feeding before switching sides.   Use a breast pump to empty the breasts if your baby is sleepy or not nursing well. You may also want to pump if you are returning to work oryou feel you are getting engorged.   Avoid bottle feeds, pacifiers, or supplemental feedings of water or juice in place of breastfeeding. Breast milk is all the food your baby needs. It is not necessary for your baby to have water or formula. In fact, to help your breasts make more milk, it is best not to give your baby supplemental feedings during the early weeks.   Be sure the baby is latched on and positioned properly while breastfeeding.   Wear a supportive bra, avoiding underwire styles.   Eat a balanced diet with enough fluids.   Rest often, relax, and take your prenatal vitamins to prevent fatigue, stress, and anemia.  If you follow these suggestions, your engorgement should improve in 24 48 hours. If you are still experiencing difficulty, call your lactation consultant or caregiver.    CARING FOR YOURSELF Take care of your breasts  Bathe or shower daily.   Avoid using soap on your nipples.   Start feedings on your left breast at one feeding and on your right breast at the next feeding.     You will notice an increase in your milk supply 2 5 days after delivery. You may feel some discomfort from engorgement, which makes your breasts very firm and often tender. Engorgement "peaks" out within 24 48 hours. In the meantime, apply warm moist towels to your breasts for 5 10 minutes before feeding. Gentle massage and expression of some milk before feeding will soften your breasts, making it easier for your baby to latch on.   Wear a well-fitting nursing bra, and air dry your nipples for a 3 4minutes after each feeding.   Only use cotton bra pads.   Only use pure lanolin on your nipples after nursing. You do not need to wash it off before feeding the baby again. Another option is to express a few drops of breast milk and gently massage it into your nipples.  Take care of yourself  Eat well-balanced meals and nutritious snacks.   Drinking milk, fruit juice, and water to satisfy your thirst (about 8 glasses a day).   Get plenty of rest.  Avoid foods that you notice affect the baby in a bad way.  SEEK MEDICAL CARE IF:   You have difficulty with breastfeeding and need help.   You have a hard, red, sore area on your breast that is accompanied by a fever.   Your baby is too sleepy to eat well or is having trouble sleeping.   Your baby is wetting less than 6 diapers a day, by 5 days of age.   Your baby's skin or white part of his or her eyes is more yellow than it was in the hospital.   You feel depressed.  Document Released: 10/12/2005 Document Revised: 04/12/2012 Document Reviewed: 01/10/2012 [employer] Patient Information 2013 [employer].  

## 2012-11-21 NOTE — Progress Notes (Signed)
Doing well. Labor precautions  

## 2012-11-25 ENCOUNTER — Ambulatory Visit (INDEPENDENT_AMBULATORY_CARE_PROVIDER_SITE_OTHER): Payer: Medicaid Other | Admitting: Obstetrics & Gynecology

## 2012-11-25 VITALS — BP 110/81 | Wt 251.0 lb

## 2012-11-25 DIAGNOSIS — O26899 Other specified pregnancy related conditions, unspecified trimester: Secondary | ICD-10-CM

## 2012-11-25 DIAGNOSIS — L0291 Cutaneous abscess, unspecified: Secondary | ICD-10-CM

## 2012-11-25 DIAGNOSIS — N898 Other specified noninflammatory disorders of vagina: Secondary | ICD-10-CM

## 2012-11-25 DIAGNOSIS — Z8659 Personal history of other mental and behavioral disorders: Secondary | ICD-10-CM

## 2012-11-25 DIAGNOSIS — O3660X0 Maternal care for excessive fetal growth, unspecified trimester, not applicable or unspecified: Secondary | ICD-10-CM

## 2012-11-25 DIAGNOSIS — Z3480 Encounter for supervision of other normal pregnancy, unspecified trimester: Secondary | ICD-10-CM

## 2012-11-25 DIAGNOSIS — O09299 Supervision of pregnancy with other poor reproductive or obstetric history, unspecified trimester: Secondary | ICD-10-CM

## 2012-11-25 DIAGNOSIS — L0390 Cellulitis, unspecified: Secondary | ICD-10-CM

## 2012-11-25 MED ORDER — CEPHALEXIN 500 MG PO CAPS: 500.0000 mg | ORAL_CAPSULE | Freq: Four times a day (QID) | ORAL | Status: DC

## 2012-11-25 NOTE — Patient Instructions (Signed)
Return to clinic for any obstetric concerns or go to [facility] for evaluation  

## 2012-11-25 NOTE — Progress Notes (Signed)
Size > dates, ultrasound ordered.  Significant cervical change since last visit.  Significant edema, tenderness and blanching erythema noted in lower pannus area concerning for cellulitis.  Keflex prescribed.  No other complaints or concerns.  Fetal movement and labor precautions reviewed.  If still pregnant next week, start postdates testing.

## 2012-11-25 NOTE — Progress Notes (Signed)
Patient is having increased discomfort in lower belly, redness and swelling, she also feels it in her inner thighs.

## 2012-11-28 ENCOUNTER — Ambulatory Visit (INDEPENDENT_AMBULATORY_CARE_PROVIDER_SITE_OTHER): Payer: Medicaid Other | Admitting: Family Medicine

## 2012-11-28 ENCOUNTER — Other Ambulatory Visit: Payer: Self-pay | Admitting: Obstetrics & Gynecology

## 2012-11-28 ENCOUNTER — Encounter (HOSPITAL_COMMUNITY): Payer: Self-pay

## 2012-11-28 ENCOUNTER — Ambulatory Visit (HOSPITAL_COMMUNITY)
Admission: RE | Admit: 2012-11-28 | Discharge: 2012-11-28 | Disposition: A | Discharge status: Home / Self Care | Payer: Medicaid Other | Source: Ambulatory Visit | Attending: Obstetrics & Gynecology | Admitting: Obstetrics & Gynecology

## 2012-11-28 VITALS — BP 108/76 | Wt 253.0 lb

## 2012-11-28 DIAGNOSIS — Z3480 Encounter for supervision of other normal pregnancy, unspecified trimester: Secondary | ICD-10-CM

## 2012-11-28 DIAGNOSIS — O3660X0 Maternal care for excessive fetal growth, unspecified trimester, not applicable or unspecified: Secondary | ICD-10-CM

## 2012-11-28 DIAGNOSIS — O26899 Other specified pregnancy related conditions, unspecified trimester: Secondary | ICD-10-CM

## 2012-11-28 DIAGNOSIS — O2243 Hemorrhoids in pregnancy, third trimester: Secondary | ICD-10-CM

## 2012-11-28 DIAGNOSIS — N898 Other specified noninflammatory disorders of vagina: Secondary | ICD-10-CM

## 2012-11-28 DIAGNOSIS — Z3689 Encounter for other specified antenatal screening: Secondary | ICD-10-CM | POA: Insufficient documentation

## 2012-11-28 DIAGNOSIS — Z8659 Personal history of other mental and behavioral disorders: Secondary | ICD-10-CM

## 2012-11-28 NOTE — Progress Notes (Signed)
EFW 8 1/2-9# Membranes stripped.

## 2012-11-28 NOTE — Patient Instructions (Signed)
Pregnancy - Third Trimester The third trimester of pregnancy (the last 3 months) is a period of the most rapid growth for you and your baby. The baby approaches a length of 20 inches and a weight of 6 to 10 pounds. The baby is adding on fat and getting ready for life outside your body. While inside, babies have periods of sleeping and waking, suck their thumbs, and hiccups. You can often feel small contractions of the uterus. This is false labor. It is also called Braxton-Hicks contractions. This is like a practice for labor. The usual problems in this stage of pregnancy include more difficulty breathing, swelling of the hands and feet from water retention, and having to urinate more often because of the uterus and baby pressing on your bladder.  PRENATAL EXAMS  Blood work may continue to be done during prenatal exams. These tests are done to check on your health and the probable health of your baby. Blood work is used to follow your blood levels (hemoglobin). Anemia (low hemoglobin) is common during pregnancy. Iron and vitamins are given to help prevent this. You may also continue to be checked for diabetes. Some of the past blood tests may be done again.  The size of the uterus is measured during each visit. This makes sure your baby is growing properly according to your pregnancy dates.  Your blood pressure is checked every prenatal visit. This is to make sure you are not getting toxemia.  Your urine is checked every prenatal visit for infection, diabetes and protein.  Your weight is checked at each visit. This is done to make sure gains are happening at the suggested rate and that you and your baby are growing normally.  Sometimes, an ultrasound is performed to confirm the position and the proper growth and development of the baby. This is a test done that bounces harmless sound waves off the baby so your caregiver can more accurately determine due dates.  Discuss the type of pain medication  and anesthesia you will have during your labor and delivery.  Discuss the possibility and anesthesia if a Cesarean Section might be necessary.  Inform your caregiver if there is any mental or physical violence at home. Sometimes, a specialized non-stress test, contraction stress test and biophysical profile are done to make sure the baby is not having a problem. Checking the amniotic fluid surrounding the baby is called an amniocentesis. The amniotic fluid is removed by sticking a needle into the belly (abdomen). This is sometimes done near the end of pregnancy if an early delivery is required. In this case, it is done to help make sure the baby's lungs are mature enough for the baby to live outside of the womb. If the lungs are not mature and it is unsafe to deliver the baby, an injection of cortisone medication is given to the mother 1 to 2 days before the delivery. This helps the baby's lungs mature and makes it safer to deliver the baby. CHANGES OCCURING IN THE THIRD TRIMESTER OF PREGNANCY Your body goes through many changes during pregnancy. They vary from person to person. Talk to your caregiver about changes you notice and are concerned about.  During the last trimester, you have probably had an increase in your appetite. It is normal to have cravings for certain foods. This varies from person to person and pregnancy to pregnancy.  You may begin to get stretch marks on your hips, abdomen, and breasts. These are normal changes in the body  during pregnancy. There are no exercises or medications to take which prevent this change.  Constipation may be treated with a stool softener or adding bulk to your diet. Drinking lots of fluids, fiber in vegetables, fruits, and whole grains are helpful.  Exercising is also helpful. If you have been very active up until your pregnancy, most of these activities can be continued during your pregnancy. If you have been less active, it is helpful to start an  exercise program such as walking. Consult your caregiver before starting exercise programs.  Avoid all smoking, alcohol, un-prescribed drugs, herbs and "street drugs" during your pregnancy. These chemicals affect the formation and growth of the baby. Avoid chemicals throughout the pregnancy to ensure the delivery of a healthy infant.  Backache, varicose veins and hemorrhoids may develop or get worse.  You will tire more easily in the third trimester, which is normal.  The baby's movements may be stronger and more often.  You may become short of breath easily.  Your belly button may stick out.  A yellow discharge may leak from your breasts called colostrum.  You may have a bloody mucus discharge. This usually occurs a few days to a week before labor begins. HOME CARE INSTRUCTIONS   Keep your caregiver's appointments. Follow your caregiver's instructions regarding medication use, exercise, and diet.  During pregnancy, you are providing food for you and your baby. Continue to eat regular, well-balanced meals. Choose foods such as meat, fish, milk and other low fat dairy products, vegetables, fruits, and whole-grain breads and cereals. Your caregiver will tell you of the ideal weight gain.  A physical sexual relationship may be continued throughout pregnancy if there are no other problems such as early (premature) leaking of amniotic fluid from the membranes, vaginal bleeding, or belly (abdominal) pain.  Exercise regularly if there are no restrictions. Check with your caregiver if you are unsure of the safety of your exercises. Greater weight gain will occur in the last 2 trimesters of pregnancy. Exercising helps:  Control your weight.  Get you in shape for labor and delivery.  You lose weight after you deliver.  Rest a lot with legs elevated, or as needed for leg cramps or low back pain.  Wear a good support or jogging bra for breast tenderness during pregnancy. This may help if worn  during sleep. Pads or tissues may be used in the bra if you are leaking colostrum.  Do not use hot tubs, steam rooms, or saunas.  Wear your seat belt when driving. This protects you and your baby if you are in an accident.  Avoid raw meat, cat litter boxes and soil used by cats. These carry germs that can cause birth defects in the baby.  It is easier to loose urine during pregnancy. Tightening up and strengthening the pelvic muscles will help with this problem. You can practice stopping your urination while you are going to the bathroom. These are the same muscles you need to strengthen. It is also the muscles you would use if you were trying to stop from passing gas. You can practice tightening these muscles up 10 times a set and repeating this about 3 times per day. Once you know what muscles to tighten up, do not perform these exercises during urination. It is more likely to cause an infection by backing up the urine.  Ask for help if you have financial, counseling or nutritional needs during pregnancy. Your caregiver will be able to offer counseling for these  needs as well as refer you for other special needs.  Make a list of emergency phone numbers and have them available.  Plan on getting help from family or friends when you go home from the hospital.  Make a trial run to the hospital.  Take prenatal classes with the father to understand, practice and ask questions about the labor and delivery.  Prepare the baby's room/nursery.  Do not travel out of the city unless it is absolutely necessary and with the advice of your caregiver.  Wear only low or no heal shoes to have better balance and prevent falling. MEDICATIONS AND DRUG USE IN PREGNANCY  Take prenatal vitamins as directed. The vitamin should contain 1 milligram of folic acid. Keep all vitamins out of reach of children. Only a couple vitamins or tablets containing iron may be fatal to a baby or young child when  ingested.  Avoid use of all medications, including herbs, over-the-counter medications, not prescribed or suggested by your caregiver. Only take over-the-counter or prescription medicines for pain, discomfort, or fever as directed by your caregiver. Do not use aspirin, ibuprofen (Motrin, Advil, Nuprin) or naproxen (Aleve) unless OK'd by your caregiver.  Let your caregiver also know about herbs you may be using.  Alcohol is related to a number of birth defects. This includes fetal alcohol syndrome. All alcohol, in any form, should be avoided completely. Smoking will cause low birth rate and premature babies.  Street/illegal drugs are very harmful to the baby. They are absolutely forbidden. A baby born to an addicted mother will be addicted at birth. The baby will go through the same withdrawal an adult does. SEEK MEDICAL CARE IF: You have any concerns or worries during your pregnancy. It is better to call with your questions if you feel they cannot wait, rather than worry about them. DECISIONS ABOUT CIRCUMCISION You may or may not know the sex of your baby. If you know your baby is a boy, it may be time to think about circumcision. Circumcision is the removal of the foreskin of the penis. This is the skin that covers the sensitive end of the penis. There is no proven medical need for this. Often this decision is made on what is popular at the time or based upon religious beliefs and social issues. You can discuss these issues with your caregiver or pediatrician. SEEK IMMEDIATE MEDICAL CARE IF:   An unexplained oral temperature above 102 F (38.9 C) develops, or as your caregiver suggests.  You have leaking of fluid from the vagina (birth canal). If leaking membranes are suspected, take your temperature and tell your caregiver of this when you call.  There is vaginal spotting, bleeding or passing clots. Tell your caregiver of the amount and how many pads are used.  You develop a bad smelling  vaginal discharge with a change in the color from clear to white.  You develop vomiting that lasts more than 24 hours.  You develop chills or fever.  You develop shortness of breath.  You develop burning on urination.  You loose more than 2 pounds of weight or gain more than 2 pounds of weight or as suggested by your caregiver.  You notice sudden swelling of your face, hands, and feet or legs.  You develop belly (abdominal) pain. Round ligament discomfort is a common non-cancerous (benign) cause of abdominal pain in pregnancy. Your caregiver still must evaluate you.  You develop a severe headache that does not go away.  You develop visual  problems, blurred or double vision.  If you have not felt your baby move for more than 1 hour. If you think the baby is not moving as much as usual, eat something with sugar in it and lie down on your left side for an hour. The baby should move at least 4 to 5 times per hour. Call right away if your baby moves less than that.  You fall, are in a car accident or any kind of trauma.  There is mental or physical violence at home. Document Released: 10/06/2001 Document Revised: 01/04/2012 Document Reviewed: 04/10/2009 [facility] Patient Information 2013 [place], [place].  Contraception Choices Contraception (birth control) is the use of any methods or devices to prevent pregnancy. Below are some methods to help avoid pregnancy. HORMONAL METHODS   Contraceptive implant. This is a thin, plastic tube containing progesterone hormone. It does not contain estrogen hormone. Your caregiver inserts the tube in the inner part of the upper arm. The tube can remain in place for up to 3 years. After 3 years, the implant must be removed. The implant prevents the ovaries from releasing an egg (ovulation), thickens the cervical mucus which prevents sperm from entering the uterus, and thins the lining of the inside of the uterus.  Progesterone-only injections. These  injections are given every 3 months by your caregiver to prevent pregnancy. This synthetic progesterone hormone stops the ovaries from releasing eggs. It also thickens cervical mucus and changes the uterine lining. This makes it harder for sperm to survive in the uterus.  Birth control pills. These pills contain estrogen and progesterone hormone. They work by stopping the egg from forming in the ovary (ovulation). Birth control pills are prescribed by a caregiver.Birth control pills can also be used to treat heavy periods.  Minipill. This type of birth control pill contains only the progesterone hormone. They are taken every day of each month and must be prescribed by your caregiver.  Birth control patch. The patch contains hormones similar to those in birth control pills. It must be changed once a week and is prescribed by a caregiver.  Vaginal ring. The ring contains hormones similar to those in birth control pills. It is left in the vagina for 3 weeks, removed for 1 week, and then a new one is put back in place. The patient must be comfortable inserting and removing the ring from the vagina.A caregiver's prescription is necessary.  Emergency contraception. Emergency contraceptives prevent pregnancy after unprotected sexual intercourse. This pill can be taken right after sex or up to 5 days after unprotected sex. It is most effective the sooner you take the pills after having sexual intercourse. Emergency contraceptive pills are available without a prescription. Check with your pharmacist. Do not use emergency contraception as your only form of birth control. BARRIER METHODS   Female condom. This is a thin sheath (latex or rubber) that is worn over the penis during sexual intercourse. It can be used with spermicide to increase effectiveness.  Female condom. This is a soft, loose-fitting sheath that is put into the vagina before sexual intercourse.  Diaphragm. This is a soft, latex, dome-shaped  barrier that must be fitted by a caregiver. It is inserted into the vagina, along with a spermicidal jelly. It is inserted before intercourse. The diaphragm should be left in the vagina for 6 to 8 hours after intercourse.  Cervical cap. This is a round, soft, latex or plastic cup that fits over the cervix and must be fitted by a  caregiver. The cap can be left in place for up to 48 hours after intercourse.  Sponge. This is a soft, circular piece of polyurethane foam. The sponge has spermicide in it. It is inserted into the vagina after wetting it and before sexual intercourse.  Spermicides. These are chemicals that kill or block sperm from entering the cervix and uterus. They come in the form of creams, jellies, suppositories, foam, or tablets. They do not require a prescription. They are inserted into the vagina with an applicator before having sexual intercourse. The process must be repeated every time you have sexual intercourse. INTRAUTERINE CONTRACEPTION  Intrauterine device (IUD). This is a T-shaped device that is put in a woman's uterus during a menstrual period to prevent pregnancy. There are 2 types:  Copper IUD. This type of IUD is wrapped in copper wire and is placed inside the uterus. Copper makes the uterus and fallopian tubes produce a fluid that kills sperm. It can stay in place for 10 years.  Hormone IUD. This type of IUD contains the hormone progestin (synthetic progesterone). The hormone thickens the cervical mucus and prevents sperm from entering the uterus, and it also thins the uterine lining to prevent implantation of a fertilized egg. The hormone can weaken or kill the sperm that get into the uterus. It can stay in place for 5 years. PERMANENT METHODS OF CONTRACEPTION  Female tubal ligation. This is when the woman's fallopian tubes are surgically sealed, tied, or blocked to prevent the egg from traveling to the uterus.  Female sterilization. This is when the female has the tubes  that carry sperm tied off (vasectomy).This blocks sperm from entering the vagina during sexual intercourse. After the procedure, the man can still ejaculate fluid (semen). NATURAL PLANNING METHODS  Natural family planning. This is not having sexual intercourse or using a barrier method (condom, diaphragm, cervical cap) on days the woman could become pregnant.  Calendar method. This is keeping track of the length of each menstrual cycle and identifying when you are fertile.  Ovulation method. This is avoiding sexual intercourse during ovulation.  Symptothermal method. This is avoiding sexual intercourse during ovulation, using a thermometer and ovulation symptoms.  Post-ovulation method. This is timing sexual intercourse after you have ovulated. Regardless of which type or method of contraception you choose, it is important that you use condoms to protect against the transmission of sexually transmitted diseases (STDs). Talk with your caregiver about which form of contraception is most appropriate for you. Document Released: 10/12/2005 Document Revised: 01/04/2012 Document Reviewed: 02/18/2011 [facility] Patient Information 2013 [place], [place].  Breastfeeding Deciding to breastfeed is one of the best choices you can make for you and your baby. The information that follows gives a brief overview of the benefits of breastfeeding as well as common topics surrounding breastfeeding. BENEFITS OF BREASTFEEDING For the baby  The first milk (colostrum) helps the baby's digestive system function better.   There are antibodies in the mother's milk that help the baby fight off infections.   The baby has a lower incidence of asthma, allergies, and sudden infant death syndrome (SIDS).   The nutrients in breast milk are better for the baby than infant formulas, and breast milk helps the baby's brain grow better.   Babies who breastfeed have less gas, colic, and constipation.  For the  mother  Breastfeeding helps develop a very special bond between the mother and her baby.   Breastfeeding is convenient, always available at the correct temperature, and costs nothing.  Breastfeeding burns calories in the mother and helps her lose weight that was gained during pregnancy.   Breastfeeding makes the uterus contract back down to normal size faster and slows bleeding following delivery.   Breastfeeding mothers have a lower risk of developing breast cancer.  BREASTFEEDING FREQUENCY  A healthy, full-term baby may breastfeed as often as every hour or space his or her feedings to every 3 hours.   Watch your baby for signs of hunger. Nurse your baby if he or she shows signs of hunger. How often you nurse will vary from baby to baby.   Nurse as often as the baby requests, or when you feel the need to reduce the fullness of your breasts.   Awaken the baby if it has been 3 4 hours since the last feeding.   Frequent feeding will help the mother make more milk and will help prevent problems, such as sore nipples and engorgement of the breasts.  BABY'S POSITION AT THE BREAST  Whether lying down or sitting, be sure that the baby's tummy is facing your tummy.   Support the breast with 4 fingers underneath the breast and the thumb above. Make sure your fingers are well away from the nipple and baby's mouth.   Stroke the baby's lips gently with your finger or nipple.   When the baby's mouth is open wide enough, place all of your nipple and as much of the areola as possible into your baby's mouth.   Pull the baby in close so the tip of the nose and the baby's cheeks touch the breast during the feeding.  FEEDINGS AND SUCTION  The length of each feeding varies from baby to baby and from feeding to feeding.   The baby must suck about 2 3 minutes for your milk to get to him or her. This is called a "let down." For this reason, allow the baby to feed on each breast as long  as he or she wants. Your baby will end the feeding when he or she has received the right balance of nutrients.   To break the suction, put your finger into the corner of the baby's mouth and slide it between his or her gums before removing your breast from his or her mouth. This will help prevent sore nipples.  HOW TO TELL WHETHER YOUR BABY IS GETTING ENOUGH BREAST MILK. Wondering whether or not your baby is getting enough milk is a common concern among mothers. You can be assured that your baby is getting enough milk if:   Your baby is actively sucking and you hear swallowing.   Your baby seems relaxed and satisfied after a feeding.   Your baby nurses at least 8 12 times in a 24 hour time period. Nurse your baby until he or she unlatches or falls asleep at the first breast (at least 10 20 minutes), then offer the second side.   Your baby is wetting 5 6 disposable diapers (6 8 cloth diapers) in a 24 hour period by 59 22 days of age.   Your baby is having at least 3 4 stools every 24 hours for the first 6 weeks. The stool should be soft and yellow.   Your baby should gain 4 7 ounces per week after he or she is 19 days old.   Your breasts feel softer after nursing.  REDUCING BREAST ENGORGEMENT  In the first week after your baby is born, you may experience signs of breast engorgement. When breasts  are engorged, they feel heavy, warm, full, and may be tender to the touch. You can reduce engorgement if you:   Nurse frequently, every 2 3 hours. Mothers who breastfeed early and often have fewer problems with engorgement.   Place light ice packs on your breasts for 10 20 minutes between feedings. This reduces swelling. Wrap the ice packs in a lightweight towel to protect your skin. Bags of frozen vegetables work well for this purpose.   Take a warm shower or apply warm, moist heat to your breast for 5 10 minutes just before each feeding. This increases circulation and helps the milk flow.    Gently massage your breast before and during the feeding. Using your finger tips, massage from the chest wall towards your nipple in a circular motion.   Make sure that the baby empties at least one breast at every feeding before switching sides.   Use a breast pump to empty the breasts if your baby is sleepy or not nursing well. You may also want to pump if you are returning to work oryou feel you are getting engorged.   Avoid bottle feeds, pacifiers, or supplemental feedings of water or juice in place of breastfeeding. Breast milk is all the food your baby needs. It is not necessary for your baby to have water or formula. In fact, to help your breasts make more milk, it is best not to give your baby supplemental feedings during the early weeks.   Be sure the baby is latched on and positioned properly while breastfeeding.   Wear a supportive bra, avoiding underwire styles.   Eat a balanced diet with enough fluids.   Rest often, relax, and take your prenatal vitamins to prevent fatigue, stress, and anemia.  If you follow these suggestions, your engorgement should improve in 24 48 hours. If you are still experiencing difficulty, call your lactation consultant or caregiver.  CARING FOR YOURSELF Take care of your breasts  Bathe or shower daily.   Avoid using soap on your nipples.   Start feedings on your left breast at one feeding and on your right breast at the next feeding.   You will notice an increase in your milk supply 2 5 days after delivery. You may feel some discomfort from engorgement, which makes your breasts very firm and often tender. Engorgement "peaks" out within 24 48 hours. In the meantime, apply warm moist towels to your breasts for 5 10 minutes before feeding. Gentle massage and expression of some milk before feeding will soften your breasts, making it easier for your baby to latch on.   Wear a well-fitting nursing bra, and air dry your nipples for a 3  after each feeding.   Only use cotton bra pads.   Only use pure lanolin on your nipples after nursing. You do not need to wash it off before feeding the baby again. Another option is to express a few drops of breast milk and gently massage it into your nipples.  Take care of yourself  Eat well-balanced meals and nutritious snacks.   Drinking milk, fruit juice, and water to satisfy your thirst (about 8 glasses a day).   Get plenty of rest.  Avoid foods that you notice affect the baby in a bad way.  SEEK MEDICAL CARE IF:   You have difficulty with breastfeeding and need help.   You have a hard, red, sore area on your breast that is accompanied by a fever.   Your baby is  too sleepy to eat well or is having trouble sleeping.   Your baby is wetting less than 6 diapers a day, by 83 days of age.   Your baby's skin or white part of his or her eyes is more yellow than it was in the hospital.   You feel depressed.  Document Released: 10/12/2005 Document Revised: 04/12/2012 Document Reviewed: 01/10/2012 [employer] [facility] [employer] [facility] Patient Information 2013 [place], [place].

## 2012-11-29 ENCOUNTER — Inpatient Hospital Stay (HOSPITAL_COMMUNITY)
Admission: AD | Admit: 2012-11-29 | Discharge: 2012-12-01 | DRG: 775 | Disposition: A | Discharge status: Home / Self Care | Payer: Medicaid Other | Source: Ambulatory Visit | Attending: Family Medicine | Admitting: Family Medicine

## 2012-11-29 ENCOUNTER — Encounter (HOSPITAL_COMMUNITY): Payer: Self-pay | Admitting: *Deleted

## 2012-11-29 ENCOUNTER — Inpatient Hospital Stay (HOSPITAL_COMMUNITY): Payer: Medicaid Other | Admitting: Anesthesiology

## 2012-11-29 ENCOUNTER — Encounter (HOSPITAL_COMMUNITY): Payer: Self-pay | Admitting: Anesthesiology

## 2012-11-29 DIAGNOSIS — O26899 Other specified pregnancy related conditions, unspecified trimester: Secondary | ICD-10-CM

## 2012-11-29 DIAGNOSIS — F329 Major depressive disorder, single episode, unspecified: Secondary | ICD-10-CM

## 2012-11-29 DIAGNOSIS — O99344 Other mental disorders complicating childbirth: Secondary | ICD-10-CM

## 2012-11-29 DIAGNOSIS — F3289 Other specified depressive episodes: Secondary | ICD-10-CM | POA: Diagnosis present

## 2012-11-29 DIAGNOSIS — O99892 Other specified diseases and conditions complicating childbirth: Principal | ICD-10-CM | POA: Diagnosis present

## 2012-11-29 DIAGNOSIS — Z22330 Carrier of Group B streptococcus: Secondary | ICD-10-CM

## 2012-11-29 DIAGNOSIS — O9989 Other specified diseases and conditions complicating pregnancy, childbirth and the puerperium: Secondary | ICD-10-CM

## 2012-11-29 DIAGNOSIS — O2243 Hemorrhoids in pregnancy, third trimester: Secondary | ICD-10-CM

## 2012-11-29 DIAGNOSIS — Z3480 Encounter for supervision of other normal pregnancy, unspecified trimester: Secondary | ICD-10-CM

## 2012-11-29 DIAGNOSIS — N898 Other specified noninflammatory disorders of vagina: Secondary | ICD-10-CM

## 2012-11-29 DIAGNOSIS — F53 Postpartum depression: Secondary | ICD-10-CM

## 2012-11-29 DIAGNOSIS — Z8659 Personal history of other mental and behavioral disorders: Secondary | ICD-10-CM

## 2012-11-29 LAB — ABO/RH: ABO/RH(D): O POS

## 2012-11-29 LAB — CBC
HCT: 34.2 % — ABNORMAL LOW (ref 36.0–46.0)
Hemoglobin: 11.1 g/dL — ABNORMAL LOW (ref 12.0–15.0)
MCH: 26.8 pg (ref 26.0–34.0)
MCHC: 32.5 g/dL (ref 30.0–36.0)
MCV: 82.6 fL (ref 78.0–100.0)
Platelets: 339 10*3/uL (ref 150–400)
RBC: 4.14 MIL/uL (ref 3.87–5.11)
RDW: 15.5 % (ref 11.5–15.5)
WBC: 16.7 10*3/uL — ABNORMAL HIGH (ref 4.0–10.5)

## 2012-11-29 LAB — TYPE AND SCREEN
ABO/RH(D): O POS
Antibody Screen: NEGATIVE

## 2012-11-29 LAB — RPR: RPR Ser Ql: NONREACTIVE

## 2012-11-29 LAB — OB RESULTS CONSOLE GBS: GBS: POSITIVE

## 2012-11-29 MED ORDER — EPHEDRINE 5 MG/ML INJ
10.0000 mg | INTRAVENOUS | Status: DC | PRN
  Filled 2012-11-29: qty 4

## 2012-11-29 MED ORDER — WITCH HAZEL-GLYCERIN EX PADS
1.0000 "application " | MEDICATED_PAD | CUTANEOUS | Status: DC | PRN
  Administered 2012-11-29: 1 via TOPICAL

## 2012-11-29 MED ORDER — OXYTOCIN 40 UNITS IN LACTATED RINGERS INFUSION - SIMPLE MED
1.0000 m[IU]/min | INTRAVENOUS | Status: DC
  Administered 2012-11-29: 2 m[IU]/min via INTRAVENOUS

## 2012-11-29 MED ORDER — LACTATED RINGERS IV SOLN: 500.0000 mL | INTRAVENOUS | Status: DC | PRN

## 2012-11-29 MED ORDER — PENICILLIN G POTASSIUM 5000000 UNITS IJ SOLR
2.5000 10*6.[IU] | INTRAMUSCULAR | Status: DC
  Administered 2012-11-29 (×2): 2.5 10*6.[IU] via INTRAVENOUS
  Filled 2012-11-29 (×5): qty 2.5

## 2012-11-29 MED ORDER — SIMETHICONE 80 MG PO CHEW: 80.0000 mg | CHEWABLE_TABLET | ORAL | Status: DC | PRN

## 2012-11-29 MED ORDER — ONDANSETRON HCL 4 MG/2ML IJ SOLN: 4.0000 mg | INTRAMUSCULAR | Status: DC | PRN

## 2012-11-29 MED ORDER — OXYCODONE-ACETAMINOPHEN 5-325 MG PO TABS: 1.0000 | ORAL_TABLET | ORAL | Status: DC | PRN

## 2012-11-29 MED ORDER — IBUPROFEN 600 MG PO TABS: 600.0000 mg | ORAL_TABLET | Freq: Four times a day (QID) | ORAL | Status: DC | PRN

## 2012-11-29 MED ORDER — SODIUM BICARBONATE 8.4 % IV SOLN
INTRAVENOUS | Status: DC | PRN
  Administered 2012-11-29: 5 mL via EPIDURAL

## 2012-11-29 MED ORDER — IBUPROFEN 600 MG PO TABS
600.0000 mg | ORAL_TABLET | Freq: Four times a day (QID) | ORAL | Status: DC
  Administered 2012-11-30 – 2012-12-01 (×7): 600 mg via ORAL
  Filled 2012-11-29 (×7): qty 1

## 2012-11-29 MED ORDER — PHENYLEPHRINE 40 MCG/ML (10ML) SYRINGE FOR IV PUSH (FOR BLOOD PRESSURE SUPPORT)
80.0000 ug | PREFILLED_SYRINGE | INTRAVENOUS | Status: DC | PRN
  Filled 2012-11-29: qty 5

## 2012-11-29 MED ORDER — LACTATED RINGERS IV SOLN
500.0000 mL | Freq: Once | INTRAVENOUS | Status: AC
  Administered 2012-11-29: 500 mL via INTRAVENOUS

## 2012-11-29 MED ORDER — LANOLIN HYDROUS EX OINT: TOPICAL_OINTMENT | CUTANEOUS | Status: DC | PRN

## 2012-11-29 MED ORDER — DIBUCAINE 1 % RE OINT
1.0000 "application " | TOPICAL_OINTMENT | RECTAL | Status: DC | PRN
  Filled 2012-11-29: qty 28

## 2012-11-29 MED ORDER — TERBUTALINE SULFATE 1 MG/ML IJ SOLN: 0.2500 mg | Freq: Once | INTRAMUSCULAR | Status: DC | PRN

## 2012-11-29 MED ORDER — ONDANSETRON HCL 4 MG/2ML IJ SOLN: 4.0000 mg | Freq: Four times a day (QID) | INTRAMUSCULAR | Status: DC | PRN

## 2012-11-29 MED ORDER — OXYTOCIN 40 UNITS IN LACTATED RINGERS INFUSION - SIMPLE MED
62.5000 mL/h | INTRAVENOUS | Status: DC
  Filled 2012-11-29: qty 1000

## 2012-11-29 MED ORDER — ACETAMINOPHEN 325 MG PO TABS
650.0000 mg | ORAL_TABLET | ORAL | Status: DC | PRN
  Administered 2012-11-29: 650 mg via ORAL
  Filled 2012-11-29: qty 1
  Filled 2012-11-29: qty 2

## 2012-11-29 MED ORDER — METHYLERGONOVINE MALEATE 0.2 MG PO TABS
0.2000 mg | ORAL_TABLET | Freq: Once | ORAL | Status: AC
  Administered 2012-11-29: 0.2 mg via ORAL
  Filled 2012-11-29: qty 1

## 2012-11-29 MED ORDER — PHENYLEPHRINE 40 MCG/ML (10ML) SYRINGE FOR IV PUSH (FOR BLOOD PRESSURE SUPPORT): 80.0000 ug | PREFILLED_SYRINGE | INTRAVENOUS | Status: DC | PRN

## 2012-11-29 MED ORDER — TETANUS-DIPHTH-ACELL PERTUSSIS 5-2.5-18.5 LF-MCG/0.5 IM SUSP: 0.5000 mL | Freq: Once | INTRAMUSCULAR | Status: DC

## 2012-11-29 MED ORDER — ZOLPIDEM TARTRATE 5 MG PO TABS: 5.0000 mg | ORAL_TABLET | Freq: Every evening | ORAL | Status: DC | PRN

## 2012-11-29 MED ORDER — PRENATAL MULTIVITAMIN CH
1.0000 | ORAL_TABLET | Freq: Every day | ORAL | Status: DC
  Administered 2012-11-30 – 2012-12-01 (×2): 1 via ORAL
  Filled 2012-11-29 (×2): qty 1

## 2012-11-29 MED ORDER — CITRIC ACID-SODIUM CITRATE 334-500 MG/5ML PO SOLN: 30.0000 mL | ORAL | Status: DC | PRN

## 2012-11-29 MED ORDER — OXYTOCIN BOLUS FROM INFUSION
500.0000 mL | INTRAVENOUS | Status: DC
  Administered 2012-11-29: 500 mL via INTRAVENOUS

## 2012-11-29 MED ORDER — PENICILLIN G POTASSIUM 5000000 UNITS IJ SOLR
5.0000 10*6.[IU] | Freq: Once | INTRAVENOUS | Status: AC
  Administered 2012-11-29: 5 10*6.[IU] via INTRAVENOUS
  Filled 2012-11-29: qty 5

## 2012-11-29 MED ORDER — EPHEDRINE 5 MG/ML INJ: 10.0000 mg | INTRAVENOUS | Status: DC | PRN

## 2012-11-29 MED ORDER — SENNOSIDES-DOCUSATE SODIUM 8.6-50 MG PO TABS
2.0000 | ORAL_TABLET | Freq: Every day | ORAL | Status: DC
  Administered 2012-11-29: 2 via ORAL

## 2012-11-29 MED ORDER — CEPHALEXIN 500 MG PO CAPS
500.0000 mg | ORAL_CAPSULE | Freq: Four times a day (QID) | ORAL | Status: DC
  Administered 2012-11-29 – 2012-12-01 (×6): 500 mg via ORAL
  Filled 2012-11-29 (×7): qty 1

## 2012-11-29 MED ORDER — ONDANSETRON HCL 4 MG PO TABS: 4.0000 mg | ORAL_TABLET | ORAL | Status: DC | PRN

## 2012-11-29 MED ORDER — DIPHENHYDRAMINE HCL 25 MG PO CAPS: 25.0000 mg | ORAL_CAPSULE | Freq: Four times a day (QID) | ORAL | Status: DC | PRN

## 2012-11-29 MED ORDER — FLEET ENEMA 7-19 GM/118ML RE ENEM: 1.0000 | ENEMA | RECTAL | Status: DC | PRN

## 2012-11-29 MED ORDER — DIPHENHYDRAMINE HCL 50 MG/ML IJ SOLN
12.5000 mg | INTRAMUSCULAR | Status: DC | PRN
  Administered 2012-11-29 (×2): 12.5 mg via INTRAVENOUS
  Filled 2012-11-29 (×2): qty 1

## 2012-11-29 MED ORDER — FENTANYL 2.5 MCG/ML BUPIVACAINE 1/10 % EPIDURAL INFUSION (WH - ANES)
14.0000 mL/h | INTRAMUSCULAR | Status: DC
  Administered 2012-11-29 (×2): 14 mL/h via EPIDURAL
  Filled 2012-11-29 (×2): qty 125

## 2012-11-29 MED ORDER — LACTATED RINGERS IV SOLN
INTRAVENOUS | Status: DC
  Administered 2012-11-29 (×2): via INTRAVENOUS

## 2012-11-29 MED ORDER — LIDOCAINE HCL (PF) 1 % IJ SOLN: 30.0000 mL | INTRAMUSCULAR | Status: DC | PRN

## 2012-11-29 MED ORDER — BENZOCAINE-MENTHOL 20-0.5 % EX AERO
1.0000 "application " | INHALATION_SPRAY | CUTANEOUS | Status: DC | PRN
  Administered 2012-11-29: 1 via TOPICAL
  Filled 2012-11-29: qty 56

## 2012-11-29 NOTE — Progress Notes (Signed)
Leslie Duran is a 22 y.o. G2P1001 at [redacted]w[redacted]d admitted for active labor.  Subjective: Patient comfortable with epidural. Patient's family in room.  Objective: BP 106/66  Pulse 100  Temp 97.6 F (36.4 C) (Oral)  Resp 20  Ht 5\' 4"  (1.626 m)  Wt 116.121 kg (256 lb)  BMI 43.94 kg/m2  SpO2 97%  LMP 05/26/2012     FHT:  FHR: 150 bpm, variability: moderate,  accelerations:  Present,  decelerations:  Present variables- occasional UC:   irregular, every 1-4 minutes SVE:   Dilation: 5 Effacement (%): 50 Station: -3 Exam by:: Misty Stanley, CNM Will continue to observe patient over the next few hours. Patient resting comfortably. Foley catheter to be inserted per patient request.  Labs: Lab Results  Component Value Date   WBC 16.7* 11/29/2012   HGB 11.1* 11/29/2012   HCT 34.2* 11/29/2012   MCV 82.6 11/29/2012   PLT 339 11/29/2012    Assessment / Plan: Spontaneous labor, progressing normally  Labor: Progressing normally Fetal Wellbeing:  Category II Pain Control:  Epidural Anticipated MOD:  NSVD  LEFTWICH-KIRBY, [place] 11/29/2012, 10:03 AM

## 2012-11-29 NOTE — H&P (Signed)
Chart reviewed and agree with management and plan. Pt. Is a pt. At [facility].

## 2012-11-29 NOTE — Progress Notes (Signed)
Patient was referred for history of depression/anxiety. * Referral screened out by Clinical Social Worker because none of the following criteria appear to apply: ~ History of anxiety/depression during this pregnancy, or of post-partum depression. ~ Diagnosis of anxiety and/or depression within last 3 years ~ History of depression due to pregnancy loss/loss of child OR * Patient's symptoms currently being treated with medication and/or therapy. Please contact the Clinical Social Worker if needs arise, or if patient requests.  MOB has an rx for Prozac.

## 2012-11-29 NOTE — Assessment & Plan Note (Signed)
No issues

## 2012-11-29 NOTE — H&P (Addendum)
Leslie Duran is a 23 y.o. female G52P2002 @ [redacted]w[redacted]d pt of [facility] presenting for active labor.  Her pregnancy is uncomplicated except for abdominal cellulitis treated with antibiotics.  She has a hx of postpartum depression and is treated with Zoloft.  Her largest previous baby was 7 1/2 lbs.  EFW for this baby is 8 1/2-9 lbs.  She reports good fetal movement, denies LOF, vaginal bleeding, vaginal itching/burning, urinary symptoms, h/a, dizziness, n/v, or fever/chills.     Maternal Medical History:  Reason for admission: Reason for admission: contractions.  Reason for Admission:   nauseaContractions: Onset was 3-5 hours ago.   Frequency: regular.   Duration is approximately 1 minute.   Perceived severity is strong.    Fetal activity: Perceived fetal activity is normal.   Last perceived fetal movement was within the past hour.      OB History    Grav Para Term Preterm Abortions TAB SAB Ect Mult Living   2 2 2       2      Past Medical History  Diagnosis Date  . No pertinent past medical history    Past Surgical History  Procedure Date  . No past surgeries    Family History: family history includes Diabetes in her maternal grandmother; Heart disease in her maternal grandmother; and Stroke in her maternal grandmother. Social History:  reports that she has never smoked. She has never used smokeless tobacco. She reports that she does not drink alcohol or use illicit drugs.   Prenatal Transfer Tool  Maternal Diabetes: No Genetic Screening: Declined Maternal Ultrasounds/Referrals: Normal Fetal Ultrasounds or other Referrals:  None Maternal Substance Abuse:  No Significant Maternal Medications:  None Significant Maternal Lab Results:  Lab values include: Group B Strep positive Other Comments:  Adequate prophylaxis with Penicillin  Review of Systems  Constitutional: Negative for fever, chills and malaise/fatigue.  Eyes: Negative for blurred vision.  Respiratory: Negative for cough  and shortness of breath.   Cardiovascular: Negative for chest pain.  Gastrointestinal: Positive for abdominal pain. Negative for heartburn, nausea and vomiting.  Genitourinary: Negative for dysuria, urgency and frequency.  Musculoskeletal: Negative.   Neurological: Negative for dizziness and headaches.  Psychiatric/Behavioral: Negative for depression.    Dilation: 10 Effacement (%): 100 Station: +3 Exam by:: leftwich-kirby, CNM Blood pressure 123/70, pulse 101, temperature 100.1 F (37.8 C), temperature source Oral, resp. rate 20, height 5\' 4"  (1.626 m), weight 116.121 kg (256 lb), last menstrual period 05/26/2012, SpO2 97.00%, unknown if currently breastfeeding. Maternal Exam:  Uterine Assessment: Contraction strength is moderate.  Contraction frequency is irregular.   Abdomen: Patient reports no abdominal tenderness. Estimated fetal weight is 8.5- 9  lbs.   Fetal presentation: vertex  Cervix: Cervix evaluated by digital exam.     Fetal Exam Fetal Monitor Review: Mode: ultrasound.   Baseline rate: 135.  Variability: moderate (6-25 bpm).   Pattern: accelerations present and no decelerations.    Fetal State Assessment: Category I - tracings are normal.     Physical Exam  Nursing note and vitals reviewed. Constitutional: She is oriented to person, place, and time. She appears well-developed and well-nourished.  Neck: Normal range of motion.  Cardiovascular: Normal rate.   Respiratory: Effort normal.  GI: Soft.  Genitourinary:       Cervix 5/60/-3  Musculoskeletal: Normal range of motion.  Neurological: She is alert and oriented to person, place, and time.  Skin: Skin is warm and dry.  Psychiatric: She has a normal  mood and affect. Her behavior is normal. Judgment and thought content normal.    Prenatal labs: ABO, Rh: --/--/O POS, O POS (02/04 0730) Antibody: NEG (02/04 0730) Rubella: 58.7 (10/31 1630) RPR: NON REACTIVE (02/04 0732)  HBsAg: NEGATIVE (10/31 1630)   HIV: NON REACTIVE (10/31 1630)  GBS: Positive (02/04 0000)   Assessment/Plan: Active labor GBS positive  Admit to birthing suites Penicillin for GBS prophylaxis    LEFTWICH-KIRBY, Carnella Fryman 11/29/2012, 6:33 PM

## 2012-11-29 NOTE — MAU Note (Signed)
Pt G2 P1 at 39.5wks having contractions and very uncomfortable.  Two weeks ago SVE 4 cm.

## 2012-11-29 NOTE — MAU Note (Signed)
Pt to room 170 for evaluation.  Report given to C. Fish farm manager.

## 2012-11-29 NOTE — Anesthesia Preprocedure Evaluation (Signed)
Anesthesia Evaluation  Patient identified by MRN, date of birth, ID band Patient awake    Reviewed: Allergy & Precautions, H&P , Patient's Chart, lab work & pertinent test results  Airway Mallampati: III TM Distance: >3 FB Neck ROM: full    Dental  (+) Teeth Intact   Pulmonary  breath sounds clear to auscultation        Cardiovascular Rhythm:regular Rate:Normal     Neuro/Psych    GI/Hepatic   Endo/Other    Renal/GU      Musculoskeletal   Abdominal   Peds  Hematology   Anesthesia Other Findings       Reproductive/Obstetrics (+) Pregnancy                           Anesthesia Physical Anesthesia Plan  ASA: III  Anesthesia Plan: Epidural   Post-op Pain Management:    Induction:   Airway Management Planned:   Additional Equipment:   Intra-op Plan:   Post-operative Plan:   Informed Consent: I have reviewed the patients History and Physical, chart, labs and discussed the procedure including the risks, benefits and alternatives for the proposed anesthesia with the patient or authorized representative who has indicated his/her understanding and acceptance.   Dental Advisory Given  Plan Discussed with:   Anesthesia Plan Comments: (Labs checked- platelets confirmed with RN in room. Fetal heart tracing, per RN, reported to be stable enough for sitting procedure. Discussed epidural, and patient consents to the procedure:  included risk of possible headache,backache, failed block, allergic reaction, and nerve injury. This patient was asked if she had any questions or concerns before the procedure started. )        Anesthesia Quick Evaluation  

## 2012-11-29 NOTE — H&P (Signed)
Chart reviewed and agree with management and plan.  

## 2012-11-29 NOTE — Anesthesia Procedure Notes (Signed)

## 2012-11-30 LAB — CBC
HCT: 27.6 % — ABNORMAL LOW (ref 36.0–46.0)
Hemoglobin: 8.9 g/dL — ABNORMAL LOW (ref 12.0–15.0)
MCH: 26.9 pg (ref 26.0–34.0)
MCHC: 32.2 g/dL (ref 30.0–36.0)
MCV: 83.4 fL (ref 78.0–100.0)
Platelets: 349 10*3/uL (ref 150–400)
RBC: 3.31 MIL/uL — ABNORMAL LOW (ref 3.87–5.11)
RDW: 15.5 % (ref 11.5–15.5)
WBC: 20.1 10*3/uL — ABNORMAL HIGH (ref 4.0–10.5)

## 2012-11-30 MED ORDER — METHYLERGONOVINE MALEATE 0.2 MG PO TABS
ORAL_TABLET | ORAL | Status: AC
  Filled 2012-11-30: qty 1

## 2012-11-30 MED ORDER — METHYLERGONOVINE MALEATE 0.2 MG PO TABS
0.2000 mg | ORAL_TABLET | ORAL | Status: AC
  Administered 2012-11-30 (×5): 0.2 mg via ORAL
  Filled 2012-11-30 (×4): qty 1

## 2012-11-30 MED ORDER — FLUOXETINE HCL 20 MG PO CAPS
20.0000 mg | ORAL_CAPSULE | Freq: Every day | ORAL | Status: DC
Start: 2012-11-30 — End: 2012-12-01
  Administered 2012-11-30 – 2012-12-01 (×2): 20 mg via ORAL
  Filled 2012-11-30 (×2): qty 1

## 2012-11-30 NOTE — Anesthesia Postprocedure Evaluation (Signed)
  Anesthesia Post-op Note  Patient: Leslie Duran  Procedure(s) Performed: * No procedures listed *  Patient Location: Mother/Baby  Anesthesia Type:Regional  Level of Consciousness: awake, alert  and oriented  Airway and Oxygen Therapy: Patient Spontanous Breathing  Post-op Pain: mild  Post-op Assessment: Patient's Cardiovascular Status Stable, Respiratory Function Stable, No headache, No backache, No residual numbness and No residual motor weakness  Post-op Vital Signs: stable  Complications: No apparent anesthesia complications

## 2012-11-30 NOTE — Progress Notes (Signed)
Post Partum Day 1 Subjective: up ad lib, voiding, + flatus and walking without dizziness or lightheaded.  Did have some increased lochia rubra last PM but has resolved with cytotec.   Objective: Blood pressure 94/65, pulse 89, temperature 98.1 F (36.7 C), temperature source Oral, resp. rate 18, height 5\' 4"  (1.626 m), weight 116.121 kg (256 lb), last menstrual period 05/26/2012, SpO2 97.00%, unknown if currently breastfeeding.  Physical Exam:  General: alert, cooperative and appears stated age 22: appropriate Uterine Fundus: firm  DVT Evaluation: No evidence of DVT seen on physical exam.   Basename 11/29/12 0732  HGB 11.1*  HCT 34.2*    Assessment/Plan: Plan for discharge tomorrow, Will get repeat CBC today due to increased vaginal bleeding last PM.  Plans on bottle feeding and consider circumcision.    LOS: 1 day   Oliveah Zwack R. Paulina Fusi, DO of [facility] 11/30/2012, 7:40 AM

## 2012-11-30 NOTE — Progress Notes (Signed)
Patient was referred for history of depression/anxiety. * Referral screened out by Clinical Social Worker because none of the following criteria appear to apply: ~ History of anxiety/depression during this pregnancy, or of post-partum depression. ~ Diagnosis of anxiety and/or depression within last 3 years ~ History of depression due to pregnancy loss/loss of child OR * Patient's symptoms currently being treated with medication and/or therapy. Please contact the Clinical Social Worker if needs arise, or if patient requests.  MOB has rx for Prozac. 

## 2012-11-30 NOTE — Progress Notes (Signed)
UR chart review completed.  

## 2012-11-30 NOTE — Progress Notes (Signed)
I have seen and examined this patient and I agree with the above. Cam Hai 7:44 AM 11/30/2012

## 2012-12-01 MED ORDER — IBUPROFEN 600 MG PO TABS: 600.0000 mg | ORAL_TABLET | Freq: Four times a day (QID) | ORAL | Status: DC

## 2012-12-01 MED ORDER — BENZOCAINE-MENTHOL 20-0.5 % EX AERO: 1.0000 "application " | INHALATION_SPRAY | CUTANEOUS | Status: DC | PRN

## 2012-12-01 NOTE — Progress Notes (Signed)
Spoke with pt about her post partum depression history. She states that she has the prozac at home and is going to take it, she says she has a friend who will come to help her with baby and 67 month old. Offered to have [place], Child psychotherapist, come talk to her and she declined.  Resource list given on discharge instructions and pt states she already has phone numbers to call.

## 2012-12-01 NOTE — Discharge Summary (Addendum)
Jazlen E Nardone is a 22 y.o. female G63P2002 @ [redacted]w[redacted]d pt of [facility] who presented for active labor.  Pt did receive epidural and on 11/29/12, At 6:07 PM a viable and healthy female was delivered via Vaginal, Spontaneous Delivery (Presentation: Right Occiput Anterior). APGAR: 8, 9;  Placenta was delivered Intact, Spontaneous and Cord with 3 vessels.  EBL 400 cc. Pt would like to bottle feed and wants Mirena.  Also, pt had previous history of post-partum depression and is on Prozac currently.     Obstetric Discharge Summary Reason for Admission: onset of labor Prenatal Procedures: none Intrapartum Procedures: spontaneous vaginal delivery Postpartum Procedures: none Complications-Operative and Postpartum: none Hemoglobin  Date Value Range Status  11/30/2012 8.9* 12.0 - 15.0 g/dL Final     DELTA CHECK NOTED     REPEATED TO VERIFY     HCT  Date Value Range Status  11/30/2012 27.6* 36.0 - 46.0 % Final    Physical Exam:  General: alert, cooperative and appears stated age 9: appropriate Uterine Fundus: firm DVT Evaluation: No evidence of DVT seen on physical exam.  Discharge Diagnoses: Term Pregnancy-delivered  Discharge Information: Date: 12/01/2012 Activity: pelvic rest Diet: routine Medications: Ibuprofen Condition: stable Instructions: refer to practice specific booklet Discharge to: home   Newborn Data: Live born female  Birth Weight: 9 lb 9.4 oz (4349 g) APGAR: 8, 9  Home with mother.  Twana First Hess, DO of [facility] 12/01/2012, 7:58 AM  I have seen this patient and agree with the above resident's note.  LEFTWICH-KIRBY, Avamae Dehaan Certified Nurse-Midwife

## 2012-12-01 NOTE — Progress Notes (Signed)
Pt called me into the room and stated that the baby had been crying all night and neither her or the FOB had gotten any sleep. I told her they may want to take turns taking care of the baby rather than both staying up with him together. She told me that they had just fed the baby and he was still crying. I checked the baby's diaper, swaddled him, and fed the baby. As I was feeding the baby, the patient started crying and said the her "severe post-partum depression" was kicking in. I asked her if she had any support at home to help her and she said that she did not and that she was worried about how she was going to handle the baby and her 19 mo. I asked her if their was anybody that could come by even for a couple hours a day so that she could rest. She stated she would try and find somebody but was not sure that she could. I offered to take the baby for an hour or two so that she could get some rest. She said that she was ok and wanted the baby to stay with her.

## 2012-12-05 ENCOUNTER — Encounter: Payer: Medicaid Other | Admitting: Obstetrics & Gynecology

## 2012-12-05 NOTE — Discharge Summary (Signed)
I have seen this patient and agree with the above resident's note.  LEFTWICH-KIRBY, LISA Certified Nurse-Midwife 

## 2013-01-04 ENCOUNTER — Ambulatory Visit: Payer: Medicaid Other | Admitting: Family Medicine

## 2013-01-13 ENCOUNTER — Ambulatory Visit: Payer: Medicaid Other | Admitting: Obstetrics & Gynecology

## 2013-01-18 ENCOUNTER — Encounter: Payer: Self-pay | Admitting: Obstetrics and Gynecology

## 2013-01-18 ENCOUNTER — Ambulatory Visit (INDEPENDENT_AMBULATORY_CARE_PROVIDER_SITE_OTHER): Payer: Medicaid Other | Admitting: Obstetrics and Gynecology

## 2013-01-18 VITALS — BP 95/61 | HR 60 | Resp 16 | Ht 64.5 in | Wt 218.0 lb

## 2013-01-18 DIAGNOSIS — F329 Major depressive disorder, single episode, unspecified: Secondary | ICD-10-CM

## 2013-01-18 NOTE — Progress Notes (Signed)
  Subjective:     Leslie Duran is a 22 y.o. female who presents for a postpartum visit. She is 7 weeks postpartum following a spontaneous vaginal delivery. I have fully reviewed the prenatal and intrapartum course. The delivery was at 39 gestational weeks. Outcome: spontaneous vaginal delivery. Anesthesia: epidural. Postpartum course has been uncomplicated. Baby's course has been uncomplicated. Baby is feeding by bottle - Similac Advance. Bleeding started menses on 3/21. Bowel function is normal. Bladder function is normal. Patient is having protected intercourse. Contraception method is none. Postpartum depression screening: negative. Patient with history of postpartum depression and was started on Zoloft in the immediate postpartum period. Patient is doing well and would like to continue.     Review of Systems A comprehensive review of systems was negative.   Objective:    There were no vitals taken for this visit.  General:  alert, cooperative and no distress   Breasts:  inspection negative, no nipple discharge or bleeding, no masses or nodularity palpable  Lungs: clear to auscultation bilaterally  Heart:  regular rate and rhythm  Abdomen: soft, non-tender; bowel sounds normal; no masses,  no organomegaly   Vulva:  normal  Vagina: normal vagina, no discharge, exudate, lesion, or erythema  Cervix:  multiparous appearance  Corpus: normal size, contour, position, consistency, mobility, non-tender  Adnexa:  no mass, fullness, tenderness  Rectal Exam: Not performed.        Assessment:     Normal postpartum exam. Pap smear not done at today's visit.   Plan:    1. Contraception: planning Mirena IUD 2. Patient will return next week for IUD insertion. Patient advised to abstain from unprotected intercourse until IUD placement 3. Follow up in: 8 months for annual exam or as needed.

## 2013-01-24 ENCOUNTER — Ambulatory Visit (INDEPENDENT_AMBULATORY_CARE_PROVIDER_SITE_OTHER): Payer: Medicaid Other | Admitting: Family Medicine

## 2013-01-24 VITALS — BP 103/57 | HR 67 | Ht 64.5 in | Wt 218.0 lb

## 2013-01-24 DIAGNOSIS — Z30430 Encounter for insertion of intrauterine contraceptive device: Secondary | ICD-10-CM

## 2013-01-24 DIAGNOSIS — Z01812 Encounter for preprocedural laboratory examination: Secondary | ICD-10-CM

## 2013-01-24 LAB — POCT URINE PREGNANCY: Preg Test, Ur: NEGATIVE

## 2013-01-24 MED ORDER — LEVONORGESTREL 20 MCG/24HR IU IUD: 1.0000 | INTRAUTERINE_SYSTEM | Freq: Once | INTRAUTERINE | Status: AC

## 2013-01-24 NOTE — Assessment & Plan Note (Signed)
S/p insertion.

## 2013-01-24 NOTE — Patient Instructions (Signed)
Levonorgestrel intrauterine device (IUD) What is this medicine? LEVONORGESTREL IUD (LEE voe nor jes trel) is a contraceptive (birth control) device. The device is placed inside the uterus by a healthcare professional. It is used to prevent pregnancy and can also be used to treat heavy bleeding that occurs during your period. Depending on the device, it can be used for 3 to 5 years. This medicine may be used for other purposes; ask your health care provider or pharmacist if you have questions. What should I tell my health care provider before I take this medicine? They need to know if you have any of these conditions: -abnormal Pap smear -cancer of the breast, uterus, or cervix -diabetes -endometritis -genital or pelvic infection now or in the past -have more than one sexual partner or your partner has more than one partner -heart disease -history of an ectopic or tubal pregnancy -immune system problems -IUD in place -liver disease or tumor -problems with blood clots or take blood-thinners -use intravenous drugs -uterus of unusual shape -vaginal bleeding that has not been explained -an unusual or allergic reaction to levonorgestrel, other hormones, silicone, or polyethylene, medicines, foods, dyes, or preservatives -pregnant or trying to get pregnant -breast-feeding How should I use this medicine? This device is placed inside the uterus by a health care professional. Talk to your pediatrician regarding the use of this medicine in children. Special care may be needed. Overdosage: If you think you have taken too much of this medicine contact a poison control center or emergency room at once. NOTE: This medicine is only for you. Do not share this medicine with others. What if I miss a dose? This does not apply. What may interact with this medicine? Do not take this medicine with any of the following medications: -amprenavir -bosentan -fosamprenavir This medicine may also interact with  the following medications: -aprepitant -barbiturate medicines for inducing sleep or treating seizures -bexarotene -griseofulvin -medicines to treat seizures like carbamazepine, ethotoin, felbamate, oxcarbazepine, phenytoin, topiramate -modafinil -pioglitazone -rifabutin -rifampin -rifapentine -some medicines to treat HIV infection like atazanavir, indinavir, lopinavir, nelfinavir, tipranavir, ritonavir -St. John's wort -warfarin This list may not describe all possible interactions. Give your health care provider a list of all the medicines, herbs, non-prescription drugs, or dietary supplements you use. Also tell them if you smoke, drink alcohol, or use illegal drugs. Some items may interact with your medicine. What should I watch for while using this medicine? Visit your doctor or health care professional for regular check ups. See your doctor if you or your partner has sexual contact with others, becomes HIV positive, or gets a sexual transmitted disease. This product does not protect you against HIV infection (AIDS) or other sexually transmitted diseases. You can check the placement of the IUD yourself by reaching up to the top of your vagina with clean fingers to feel the threads. Do not pull on the threads. It is a good habit to check placement after each menstrual period. Call your doctor right away if you feel more of the IUD than just the threads or if you cannot feel the threads at all. The IUD may come out by itself. You may become pregnant if the device comes out. If you notice that the IUD has come out use a backup birth control method like condoms and call your health care provider. Using tampons will not change the position of the IUD and are okay to use during your period. What side effects may I notice from receiving this medicine?   Side effects that you should report to your doctor or health care professional as soon as possible: -allergic reactions like skin rash, itching or  hives, swelling of the face, lips, or tongue -fever, flu-like symptoms -genital sores -high blood pressure -no menstrual period for 6 weeks during use -pain, swelling, warmth in the leg -pelvic pain or tenderness -severe or sudden headache -signs of pregnancy -stomach cramping -sudden shortness of breath -trouble with balance, talking, or walking -unusual vaginal bleeding, discharge -yellowing of the eyes or skin Side effects that usually do not require medical attention (report to your doctor or health care professional if they continue or are bothersome): -acne -breast pain -change in sex drive or performance -changes in weight -cramping, dizziness, or faintness while the device is being inserted -headache -irregular menstrual bleeding within first 3 to 6 months of use -nausea This list may not describe all possible side effects. Call your doctor for medical advice about side effects. You may report side effects to FDA at 1-800-FDA-1088. Where should I keep my medicine? This does not apply. NOTE: This sheet is a summary. It may not cover all possible information. If you have questions about this medicine, talk to your doctor, pharmacist, or health care provider.  2013, [employer]. (11/12/2011 1:54:04 PM)  

## 2013-01-24 NOTE — Progress Notes (Signed)
Patient ID: Leslie Duran, female   DOB: 1991/03/20, 22 y.o.   MRN: [id number]  [id number] who desires [facility].    Procedure: Patient identified, informed consent performed, signed copy in chart, time out was performed.  Urine pregnancy test negative.  Speculum placed in the vagina.  Cervix visualized.  Cleaned with Betadine x 2.  Grasped anteriourly with a single tooth tenaculum.  Uterus sounded to 9cm.  Mirena IUD placed per manufacturer's recommendations.  Strings trimmed to 3 cm.   Patient given post procedure instructions and Mirena care card with expiration date.  Patient is asked to check IUD strings periodically and follow up in 4-6 weeks for IUD check.

## 2013-01-24 NOTE — Progress Notes (Signed)
Here today for Mirena IUD insertion. Pregnancy test negative.

## 2013-01-25 ENCOUNTER — Ambulatory Visit: Payer: Medicaid Other | Admitting: Obstetrics and Gynecology

## 2013-02-17 ENCOUNTER — Ambulatory Visit: Payer: Medicaid Other | Admitting: Obstetrics & Gynecology

## 2013-08-23 ENCOUNTER — Emergency Department: Payer: Self-pay | Admitting: Emergency Medicine

## 2014-08-01 DIAGNOSIS — G932 Benign intracranial hypertension: Secondary | ICD-10-CM | POA: Insufficient documentation

## 2014-08-27 ENCOUNTER — Encounter: Payer: Self-pay | Admitting: Obstetrics and Gynecology

## 2015-05-09 ENCOUNTER — Ambulatory Visit (INDEPENDENT_AMBULATORY_CARE_PROVIDER_SITE_OTHER): Payer: Medicaid Other | Admitting: Obstetrics & Gynecology

## 2015-05-09 ENCOUNTER — Encounter: Payer: Self-pay | Admitting: Obstetrics & Gynecology

## 2015-05-09 VITALS — BP 127/85 | HR 71 | Resp 16 | Wt 262.0 lb

## 2015-05-09 DIAGNOSIS — Z30431 Encounter for routine checking of intrauterine contraceptive device: Secondary | ICD-10-CM

## 2015-05-09 LAB — POCT URINE PREGNANCY: Preg Test, Ur: NEGATIVE

## 2015-05-09 NOTE — Patient Instructions (Signed)
Return to clinic for any scheduled appointments or for any gynecologic concerns as needed.   

## 2015-05-09 NOTE — Progress Notes (Addendum)
   CLINIC ENCOUNTER NOTE  History:  24 y.o. [id number] here today for IUD check. This was placed in 2014, she wants to make sure it is in correct position and "not poking her ovaries".  Has occasional lower pelvic pain and pain when crossing legs.  She denies any abnormal vaginal discharge, bleeding or other concerns.   Past Medical History  Diagnosis Date  . No pertinent past medical history     Past Surgical History  Procedure Laterality Date  . No past surgeries     The following portions of the patient's history were reviewed and updated as appropriate: allergies, current medications, past family history, past medical history, past social history, past surgical history and problem list.   Health Maintenance:  Normal pap in 08/30/2012.  Review of Systems:  Pertinent items are noted in HPI. Comprehensive review of systems was otherwise negative.  Objective:  Physical Exam BP 127/85 mmHg  Pulse 71  Resp 16  Wt 262 lb (118.842 kg) CONSTITUTIONAL: Well-developed, well-nourished female in no acute distress.  HENT:  Normocephalic, atraumatic. External right and left ear normal. Oropharynx is clear and moist EYES: Conjunctivae and EOM are normal. Pupils are equal, round, and reactive to light. No scleral icterus.  NECK: Normal range of motion, supple, no masses SKIN: Skin is warm and dry. No rash noted. Not diaphoretic. No erythema. No pallor. NEUROLGIC: Alert and oriented to person, place, and time. Normal reflexes, muscle tone coordination. No cranial nerve deficit noted. PSYCHIATRIC: Normal mood and affect. Normal behavior. Normal judgment and thought content. CARDIOVASCULAR: Normal heart rate noted RESPIRATORY: Effort and breath sounds normal, no problems with respiration noted ABDOMEN: Soft, no distention noted.   PELVIC: Normal appearing external genitalia; normal appearing vaginal mucosa and cervix. IUD strings visualized.  No abnormal discharge noted.  Normal uterine size, no  other palpable masses, no uterine or adnexal tenderness. MUSCULOSKELETAL: Normal range of motion. No edema noted.  Labs and Imaging Transvaginal ultrasound here in clinic confirmed IUD to be in correct fundal position; normal ovaries (small subcentimeter follicles noted on R ovary)  Assessment & Plan:  Patient reassured; told to return for any worsening symptoms Routine preventative health maintenance measures emphasized. Please refer to After Visit Summary for other counseling recommendations.    Jaynie CollinsUGONNA  ANYANWU, MD, FACOG Attending Obstetrician & Gynecologist [facility], [facility]

## 2016-03-14 ENCOUNTER — Emergency Department
Admission: EM | Admit: 2016-03-14 | Discharge: 2016-03-14 | Disposition: A | Discharge status: Home / Self Care | Payer: Medicaid Other | Attending: Student | Admitting: Student

## 2016-03-14 ENCOUNTER — Emergency Department: Payer: Medicaid Other

## 2016-03-14 DIAGNOSIS — Y999 Unspecified external cause status: Secondary | ICD-10-CM | POA: Diagnosis not present

## 2016-03-14 DIAGNOSIS — F329 Major depressive disorder, single episode, unspecified: Secondary | ICD-10-CM | POA: Diagnosis not present

## 2016-03-14 DIAGNOSIS — Z79899 Other long term (current) drug therapy: Secondary | ICD-10-CM | POA: Insufficient documentation

## 2016-03-14 DIAGNOSIS — S63502A Unspecified sprain of left wrist, initial encounter: Secondary | ICD-10-CM | POA: Insufficient documentation

## 2016-03-14 DIAGNOSIS — S93402A Sprain of unspecified ligament of left ankle, initial encounter: Secondary | ICD-10-CM

## 2016-03-14 DIAGNOSIS — S6992XA Unspecified injury of left wrist, hand and finger(s), initial encounter: Secondary | ICD-10-CM | POA: Diagnosis present

## 2016-03-14 DIAGNOSIS — W010XXA Fall on same level from slipping, tripping and stumbling without subsequent striking against object, initial encounter: Secondary | ICD-10-CM | POA: Diagnosis not present

## 2016-03-14 DIAGNOSIS — Y9389 Activity, other specified: Secondary | ICD-10-CM | POA: Insufficient documentation

## 2016-03-14 DIAGNOSIS — Y929 Unspecified place or not applicable: Secondary | ICD-10-CM | POA: Insufficient documentation

## 2016-03-14 MED ORDER — TRAMADOL HCL 50 MG PO TABS: 50.0000 mg | ORAL_TABLET | Freq: Four times a day (QID) | ORAL | Status: DC | PRN

## 2016-03-14 MED ORDER — OXYCODONE-ACETAMINOPHEN 5-325 MG PO TABS
1.0000 | ORAL_TABLET | Freq: Once | ORAL | Status: AC
  Administered 2016-03-14: 1 via ORAL
  Filled 2016-03-14: qty 1

## 2016-03-14 MED ORDER — IBUPROFEN 800 MG PO TABS
800.0000 mg | ORAL_TABLET | Freq: Once | ORAL | Status: AC
  Administered 2016-03-14: 800 mg via ORAL
  Filled 2016-03-14: qty 1

## 2016-03-14 NOTE — ED Provider Notes (Signed)
[facility] Emergency Department Provider Note   ____________________________________________  Time seen: Approximately 6:52 PM  I have reviewed the triage vital signs and the nursing notes.   HISTORY  Chief Complaint Fall    HPI Leslie Duran is a 25 y.o. female patient complaining of pain to the left wrist and left ankle secondary to a fall. Patient states she was attempting to get into a pickup truck when she slipped and fell. Patient denies any head injury to his complaint. Patient denies any loss of consciousness. Patient rates her pain at 7/10. Describes pain as "sharp". No palliative measures taken prior to arrival.   Past Medical History  Diagnosis Date  . No pertinent past medical history     Patient Active Problem List   Diagnosis Date Noted  . Encounter for IUD insertion 01/24/2013  . Depression 08/30/2012  . Postpartum depression 08/26/2011    Past Surgical History  Procedure Laterality Date  . No past surgeries      Current Outpatient Rx  Name  Route  Sig  Dispense  Refill  . acetaZOLAMIDE (DIAMOX) 500 MG capsule   Oral   Take 500 mg by mouth 2 (two) times daily.         . cetirizine (ZYRTEC) 10 MG tablet   Oral   Take 10 mg by mouth daily.         Marland Kitchen. EXPIRED: FLUoxetine (PROZAC) 20 MG capsule   Oral   Take 1 capsule (20 mg total) by mouth daily after breakfast.   30 capsule   11   . levonorgestrel (MIRENA) 20 MCG/24HR IUD   Intrauterine   1 Intra Uterine Device (1 each total) by Intrauterine route once. Will place in clinic   1 each   0     Allergies Tomato  Family History  Problem Relation Age of Onset  . Diabetes Maternal Grandmother   . Heart disease Maternal Grandmother   . Stroke Maternal Grandmother     Social History Social History  Substance Use Topics  . Smoking status: Never Smoker   . Smokeless tobacco: Never Used  . Alcohol Use: No    Review of Systems Constitutional: No  fever/chills Eyes: No visual changes. ENT: No sore throat. Cardiovascular: Denies chest pain. Respiratory: Denies shortness of breath. Gastrointestinal: No abdominal pain.  No nausea, no vomiting.  No diarrhea.  No constipation. Genitourinary: Negative for dysuria. Musculoskeletal:Left wrist and ankle pain Skin: Negative for rash. Neurological: Negative for headaches, focal weakness or numbness.  Allergic/Immunilogical:Tomatoes  ____________________________________________   PHYSICAL EXAM:  VITAL SIGNS: ED Triage Vitals  Enc Vitals Group     BP 03/14/16 1736 120/76 mmHg     Pulse Rate 03/14/16 1736 94     Resp 03/14/16 1736 18     Temp 03/14/16 1736 98 F (36.7 C)     Temp Source 03/14/16 1736 Oral     SpO2 03/14/16 1736 96 %     Weight 03/14/16 1736 280 lb (127.007 kg)     Height 03/14/16 1736 5\' 5"  (1.651 m)     Head Cir --      Peak Flow --      Pain Score 03/14/16 1744 7     Pain Loc --      Pain Edu? --      Excl. in GC? --     Constitutional: Alert and oriented. Well appearing and in no acute distress.Morbid obesity Eyes: Conjunctivae are normal. PERRL. EOMI. Head:  Atraumatic. Nose: No congestion/rhinnorhea. Mouth/Throat: Mucous membranes are moist.  Oropharynx non-erythematous. Neck: No stridor.  No cervical spine tenderness to palpation. Hematological/Lymphatic/Immunilogical: No cervical lymphadenopathy. Cardiovascular: Normal rate, regular rhythm. Grossly normal heart sounds.  Good peripheral circulation. Respiratory: Normal respiratory effort.  No retractions. Lungs CTAB. Gastrointestinal: Soft and nontender. No distention. No abdominal bruits. No CVA tenderness. Musculoskeletal: No obvious deformity to the left wrist or ankle. Patient is tender palpation at distal radius and the medial aspect of the left ankle. Patient has full equal range of motion patient is a atypical gait favoring the left lower extremity. Neurologic:  Normal speech and language. No  gross focal neurologic deficits are appreciated. No gait instability. Skin:  Skin is warm, dry and intact. No rash noted. Psychiatric: Mood and affect are normal. Speech and behavior are normal.  ____________________________________________   LABS (all labs ordered are listed, but only abnormal results are displayed)  Labs Reviewed - No data to display ____________________________________________  EKG   ____________________________________________  RADIOLOGY  No acute findings x-ray of the left wrist and left ankle. ____________________________________________   PROCEDURES  Procedure(s) performed: None  Critical Care performed: No  ____________________________________________   INITIAL IMPRESSION / ASSESSMENT AND PLAN / ED COURSE  Pertinent labs & imaging results that were available during my care of the patient were reviewed by me and considered in my medical decision making (see chart for details).  Sprain left wrist and ankle. Discussed x-ray findings with patient. Patient placed in a Velcro wrist and ankle splint. Patient given discharge care instructions. Patient given a prescription for tramadol and advised to take over-the-counter ibuprofen. Patient advised [facility]. ____________________________________________   FINAL CLINICAL IMPRESSION(S) / ED DIAGNOSES  Final diagnoses:  Sprain of left wrist, initial encounter  Sprain of left ankle, initial encounter      NEW MEDICATIONS STARTED DURING THIS VISIT:  New Prescriptions   No medications on file     Note:  This document was prepared using Dragon voice recognition software and may include unintentional dictation errors.    Joni Reining, PA-C 03/14/16 1901  Gayla Doss, MD 03/15/16 [phone]

## 2016-03-14 NOTE — Discharge Instructions (Signed)
Wear splints flow 3-5 days as needed. Ankle Sprain An ankle sprain is an injury to the strong, fibrous tissues (ligaments) that hold your ankle bones together.  HOME CARE   Put ice on your ankle for 1-2 days or as told by your doctor.  Put ice in a plastic bag.  Place a towel between your skin and the bag.  Leave the ice on for 15-20 minutes at a time, every 2 hours while you are awake.  Only take medicine as told by your doctor.  Raise (elevate) your injured ankle above the level of your heart as much as possible for 2-3 days.  Use crutches if your doctor tells you to. Slowly put your own weight on the affected ankle. Use the crutches until you can walk without pain.  If you have a plaster splint:  Do not rest it on anything harder than a pillow for 24 hours.  Do not put weight on it.  Do not get it wet.  Take it off to shower or bathe.  If given, use an elastic wrap or support stocking for support. Take the wrap off if your toes lose feeling (numb), tingle, or turn cold or blue.  If you have an air splint:  Add or let out air to make it comfortable.  Take it off at night and to shower and bathe.  Wiggle your toes and move your ankle up and down often while you are wearing it. GET HELP IF:  You have rapidly increasing bruising or puffiness (swelling).  Your toes feel very cold.  You lose feeling in your foot.  Your medicine does not help your pain. GET HELP RIGHT AWAY IF:   Your toes lose feeling (numb) or turn blue.  You have severe pain that is increasing. MAKE SURE YOU:   Understand these instructions.  Will watch your condition.  Will get help right away if you are not doing well or get worse.   This information is not intended to replace advice given to you by your health care provider. Make sure you discuss any questions you have with your health care provider.   Document Released: 03/30/2008 Document Revised: 11/02/2014 Document Reviewed:  04/25/2012 Elsevier Interactive Patient Education [employer]2016 [employer] Inc.

## 2016-03-14 NOTE — ED Notes (Signed)
Pt states she was trying to get into her mom Lifted up truck and fell to her left side. Pain and swelling left wrist and left ankle. Denies hitting head.

## 2017-01-09 ENCOUNTER — Emergency Department: Payer: Medicaid Other

## 2017-01-09 ENCOUNTER — Emergency Department
Admission: EM | Admit: 2017-01-09 | Discharge: 2017-01-09 | Disposition: A | Discharge status: Home / Self Care | Payer: Medicaid Other | Attending: Emergency Medicine | Admitting: Emergency Medicine

## 2017-01-09 ENCOUNTER — Encounter: Payer: Self-pay | Admitting: Emergency Medicine

## 2017-01-09 DIAGNOSIS — I880 Nonspecific mesenteric lymphadenitis: Secondary | ICD-10-CM | POA: Insufficient documentation

## 2017-01-09 DIAGNOSIS — R1031 Right lower quadrant pain: Secondary | ICD-10-CM

## 2017-01-09 HISTORY — DX: Gastro-esophageal reflux disease without esophagitis: K21.9

## 2017-01-09 HISTORY — DX: Major depressive disorder, single episode, unspecified: F32.9

## 2017-01-09 HISTORY — DX: Unspecified disorder of eye and adnexa: H57.9

## 2017-01-09 HISTORY — DX: Depression, unspecified: F32.A

## 2017-01-09 HISTORY — DX: Constipation, unspecified: K59.00

## 2017-01-09 LAB — COMPREHENSIVE METABOLIC PANEL
ALT: 39 U/L (ref 14–54)
AST: 25 U/L (ref 15–41)
Albumin: 4.1 g/dL (ref 3.5–5.0)
Alkaline Phosphatase: 40 U/L (ref 38–126)
Anion gap: 6 (ref 5–15)
BUN: 15 mg/dL (ref 6–20)
CO2: 20 mmol/L — ABNORMAL LOW (ref 22–32)
Calcium: 9.1 mg/dL (ref 8.9–10.3)
Chloride: 110 mmol/L (ref 101–111)
Creatinine, Ser: 0.96 mg/dL (ref 0.44–1.00)
GFR calc Af Amer: >60 mL/min (ref >60)
GFR calc non Af Amer: >60 mL/min (ref >60)
Glucose, Bld: 115 mg/dL — ABNORMAL HIGH (ref 65–99)
Potassium: 4.1 mmol/L (ref 3.5–5.1)
Sodium: 136 mmol/L (ref 135–145)
Total Bilirubin: 0.7 mg/dL (ref 0.3–1.2)
Total Protein: 7.9 g/dL (ref 6.5–8.1)

## 2017-01-09 LAB — URINALYSIS, COMPLETE (UACMP) WITH MICROSCOPIC
Bilirubin Urine: NEGATIVE
Glucose, UA: NEGATIVE mg/dL
Hgb urine dipstick: NEGATIVE
Ketones, ur: NEGATIVE mg/dL
Nitrite: NEGATIVE
Protein, ur: NEGATIVE mg/dL
Specific Gravity, Urine: 1.019 (ref 1.005–1.030)
pH: 5 (ref 5.0–8.0)

## 2017-01-09 LAB — CBC
HCT: 44.8 % (ref 35.0–47.0)
Hemoglobin: 15.3 g/dL (ref 12.0–16.0)
MCH: 29.8 pg (ref 26.0–34.0)
MCHC: 34.1 g/dL (ref 32.0–36.0)
MCV: 87.4 fL (ref 80.0–100.0)
Platelets: 500 10*3/uL — ABNORMAL HIGH (ref 150–440)
RBC: 5.13 MIL/uL (ref 3.80–5.20)
RDW: 13.5 % (ref 11.5–14.5)
WBC: 11.1 10*3/uL — ABNORMAL HIGH (ref 3.6–11.0)

## 2017-01-09 LAB — PREGNANCY, URINE: Preg Test, Ur: NEGATIVE

## 2017-01-09 LAB — LIPASE, BLOOD: Lipase: 12 U/L (ref 11–51)

## 2017-01-09 MED ORDER — MORPHINE SULFATE (PF) 4 MG/ML IV SOLN
4.0000 mg | Freq: Once | INTRAVENOUS | Status: AC
  Administered 2017-01-09: 4 mg via INTRAVENOUS
  Filled 2017-01-09: qty 1

## 2017-01-09 MED ORDER — IOPAMIDOL (ISOVUE-300) INJECTION 61%
30.0000 mL | Freq: Once | INTRAVENOUS | Status: AC | PRN
  Administered 2017-01-09: 30 mL via ORAL

## 2017-01-09 MED ORDER — IOPAMIDOL (ISOVUE-300) INJECTION 61%
125.0000 mL | Freq: Once | INTRAVENOUS | Status: AC | PRN
  Administered 2017-01-09: 125 mL via INTRAVENOUS

## 2017-01-09 MED ORDER — ONDANSETRON 4 MG PO TBDP: 4.0000 mg | ORAL_TABLET | Freq: Three times a day (TID) | ORAL | 0 refills | Status: DC | PRN

## 2017-01-09 MED ORDER — HYDROCODONE-ACETAMINOPHEN 5-325 MG PO TABS: 1.0000 | ORAL_TABLET | ORAL | 0 refills | Status: DC | PRN

## 2017-01-09 MED ORDER — ONDANSETRON HCL 4 MG/2ML IJ SOLN
4.0000 mg | Freq: Once | INTRAMUSCULAR | Status: AC
  Administered 2017-01-09: 4 mg via INTRAVENOUS
  Filled 2017-01-09: qty 2

## 2017-01-09 NOTE — ED Notes (Addendum)
RN went to room to check on pt. She was using E cigarette in room. Informed her that is not allowed in hospital and needs to be put up. Charge RN aware.

## 2017-01-09 NOTE — Discharge Instructions (Signed)
As we discussed please take your pain and nausea medication as needed, as prescribed. Please follow-up with GI medicine in one week if he continued to have any abdominal discomfort. Return to the emergency department for any acute worsening of abdominal discomfort, or any other symptom personally concerning to yourself.

## 2017-01-09 NOTE — ED Provider Notes (Signed)
[facility] Emergency Department Provider Note  Time seen: 4:00 PM  I have reviewed the triage vital signs and the nursing notes.   HISTORY  Chief Complaint Abdominal Pain    HPI Leslie Duran is a 26 y.o. female with a past medical history with of gastric reflux, depression, presents to the emergency department for abdominal pain. According to the patient for the past 36 hours she has been experiencing significant abdominal pain which she states is fairly diffuse. States some nausea but denies vomiting. Denies diarrhea, black or bloody stool. Denies constipation with her last bowel movement being this morning. Denies dysuria or hematuria. Patient states her last period was 4 years ago but has IUD. Currently describes her pain as an 8/10. Dull aching fairly diffuse but more so on the left lower quadrant.  Past Medical History:  Diagnosis Date  . Acid reflux   . Constipation   . Depression   . Eye problems   . No pertinent past medical history     Patient Active Problem List   Diagnosis Date Noted  . Encounter for IUD insertion 01/24/2013  . Depression 08/30/2012  . Postpartum depression 08/26/2011    Past Surgical History:  Procedure Laterality Date  . NO PAST SURGERIES      Prior to Admission medications   Medication Sig Start Date End Date Taking? Authorizing Provider  acetaZOLAMIDE (DIAMOX) 500 MG capsule Take 500 mg by mouth 2 (two) times daily.    Historical Provider, MD  cetirizine (ZYRTEC) 10 MG tablet Take 10 mg by mouth daily.    Historical Provider, MD  FLUoxetine (PROZAC) 20 MG capsule Take 1 capsule (20 mg total) by mouth daily after breakfast. 09/05/12 05/09/15  Reva Bores, MD  levonorgestrel (MIRENA) 20 MCG/24HR IUD 1 Intra Uterine Device (1 each total) by Intrauterine route once. Will place in clinic 01/24/13   Reva Bores, MD  traMADol (ULTRAM) 50 MG tablet Take 1 tablet (50 mg total) by mouth every 6 (six) hours as needed for  moderate pain. 03/14/16   Joni Reining, PA-C    Allergies  Allergen Reactions  . Tomato Nausea And Vomiting and Swelling    Throat swelling    Family History  Problem Relation Age of Onset  . Diabetes Maternal Grandmother   . Heart disease Maternal Grandmother   . Stroke Maternal Grandmother     Social History Social History  Substance Use Topics  . Smoking status: Never Smoker  . Smokeless tobacco: Never Used     Comment: vapes  . Alcohol use No    Review of Systems Constitutional: Negative for fever. Cardiovascular: Negative for chest pain. Respiratory: Negative for shortness of breath. Gastrointestinal: Left lower quadrant pain, diffuse pain. Negative for vomiting or diarrhea. Genitourinary: Negative for dysuria. Musculoskeletal: Negative for back pain. Neurological: Negative for headache 10-point ROS otherwise negative.  ____________________________________________   PHYSICAL EXAM:  VITAL SIGNS: ED Triage Vitals  Enc Vitals Group     BP 01/09/17 1405 116/90     Pulse Rate 01/09/17 1405 95     Resp 01/09/17 1405 18     Temp 01/09/17 1405 97.7 F (36.5 C)     Temp Source 01/09/17 1405 Oral     SpO2 01/09/17 1405 97 %     Weight 01/09/17 1406 295 lb (133.8 kg)     Height 01/09/17 1406 5' 4.5" (1.638 m)     Head Circumference --      Peak Flow --  Pain Score 01/09/17 1406 7     Pain Loc --      Pain Edu? --      Excl. in GC? --     Constitutional: Alert and oriented. Well appearing and in no distress. Eyes: Normal exam ENT   Head: Normocephalic and atraumatic.   Mouth/Throat: Mucous membranes are moist. Cardiovascular: Normal rate, regular rhythm. No murmurs, rubs, or gallops. Respiratory: Normal respiratory effort without tachypnea nor retractions. Breath sounds are clear Gastrointestinal: Soft, moderate lower abdominal tenderness palpation especially in the left lower quadrant. Mild to moderate diffuse abdominal tenderness in all  quadrants. No rebound or guarding. No distention. Musculoskeletal: Nontender with normal range of motion in all extremities. Neurologic:  Normal speech and language. No gross focal neurologic deficits Skin:  Skin is warm, dry and intact.  Psychiatric: Mood and affect are normal. Speech and behavior are normal.   ____________________________________________    RADIOLOGY  CT consistent with mesenteric adenitis, normal appendix.  ____________________________________________   INITIAL IMPRESSION / ASSESSMENT AND PLAN / ED COURSE  Pertinent labs & imaging results that were available during my care of the patient were reviewed by me and considered in my medical decision making (see chart for details).  Patient presents the emergency department with abdominal pain for the past 36 hours. States it is worsened currently an 8/10. Patient's labs are largely within normal limits besides a mild leukocytosis, urine pregnancy test is pending. Given the patient's fairly diffuse tenderness on exam we will obtain a CT scan and creatinine/pelvis to further evaluate, treat the patient's pain and nausea while awaiting CT results. Patient agreeable to this plan.  CT scan most consistent with right lower quadrant mesenteric adenitis. Appendix is normal. Probable cholelithiasis but no right upper quadrant pain. We will treat with Norco, Zofran, we'll have the patient follow-up with GI medicine if her pain is not relieved in the next 2-3 days. I discussed return precautions. ____________________________________________   FINAL CLINICAL IMPRESSION(S) / ED DIAGNOSES  Abdominal pain Mesenteric adenitis   Minna AntisKevin Preslynn Bier, MD 01/09/17 1919

## 2017-01-09 NOTE — ED Notes (Signed)
Pt very upset over iv crying, stating "its a foreign body taking all my blood", "this is not normal, it shouldn't be here". Husband trying to calm her down. Explained it needed to be in for treatment and to find out what is wrong.

## 2017-01-09 NOTE — ED Triage Notes (Signed)
Pt state rt lower/mid abd pain x 36 hrs. Reclining seems to help the pain. Intermittent or constant depending on movement. Takes medication for constipation - last bm today. No pain with urination

## 2017-01-12 ENCOUNTER — Emergency Department: Payer: Medicaid Other

## 2017-01-12 ENCOUNTER — Encounter: Payer: Self-pay | Admitting: Emergency Medicine

## 2017-01-12 ENCOUNTER — Emergency Department
Admission: EM | Admit: 2017-01-12 | Discharge: 2017-01-12 | Disposition: A | Discharge status: Home / Self Care | Payer: Medicaid Other | Attending: Emergency Medicine | Admitting: Emergency Medicine

## 2017-01-12 DIAGNOSIS — R1084 Generalized abdominal pain: Secondary | ICD-10-CM

## 2017-01-12 DIAGNOSIS — Z79899 Other long term (current) drug therapy: Secondary | ICD-10-CM | POA: Diagnosis not present

## 2017-01-12 DIAGNOSIS — K8020 Calculus of gallbladder without cholecystitis without obstruction: Secondary | ICD-10-CM | POA: Diagnosis not present

## 2017-01-12 DIAGNOSIS — R1011 Right upper quadrant pain: Secondary | ICD-10-CM

## 2017-01-12 DIAGNOSIS — I880 Nonspecific mesenteric lymphadenitis: Secondary | ICD-10-CM | POA: Insufficient documentation

## 2017-01-12 LAB — COMPREHENSIVE METABOLIC PANEL
ALT: 22 U/L (ref 14–54)
AST: 16 U/L (ref 15–41)
Albumin: 4 g/dL (ref 3.5–5.0)
Alkaline Phosphatase: 36 U/L — ABNORMAL LOW (ref 38–126)
Anion gap: 7 (ref 5–15)
BUN: 11 mg/dL (ref 6–20)
CO2: 20 mmol/L — ABNORMAL LOW (ref 22–32)
Calcium: 9.1 mg/dL (ref 8.9–10.3)
Chloride: 111 mmol/L (ref 101–111)
Creatinine, Ser: 1 mg/dL (ref 0.44–1.00)
GFR calc Af Amer: >60 mL/min (ref >60)
GFR calc non Af Amer: >60 mL/min (ref >60)
Glucose, Bld: 109 mg/dL — ABNORMAL HIGH (ref 65–99)
Potassium: 3.8 mmol/L (ref 3.5–5.1)
Sodium: 138 mmol/L (ref 135–145)
Total Bilirubin: 0.6 mg/dL (ref 0.3–1.2)
Total Protein: 7.4 g/dL (ref 6.5–8.1)

## 2017-01-12 LAB — CBC
HCT: 42.3 % (ref 35.0–47.0)
Hemoglobin: 14.3 g/dL (ref 12.0–16.0)
MCH: 29.8 pg (ref 26.0–34.0)
MCHC: 33.7 g/dL (ref 32.0–36.0)
MCV: 88.4 fL (ref 80.0–100.0)
Platelets: 444 10*3/uL — ABNORMAL HIGH (ref 150–440)
RBC: 4.79 MIL/uL (ref 3.80–5.20)
RDW: 13.6 % (ref 11.5–14.5)
WBC: 9.9 10*3/uL (ref 3.6–11.0)

## 2017-01-12 LAB — POCT PREGNANCY, URINE: Preg Test, Ur: NEGATIVE

## 2017-01-12 LAB — URINALYSIS, COMPLETE (UACMP) WITH MICROSCOPIC
Bacteria, UA: NONE SEEN
Bilirubin Urine: NEGATIVE
Glucose, UA: NEGATIVE mg/dL
Hgb urine dipstick: NEGATIVE
Ketones, ur: NEGATIVE mg/dL
Leukocytes, UA: NEGATIVE
Nitrite: NEGATIVE
Protein, ur: NEGATIVE mg/dL
RBC / HPF: NONE SEEN RBC/hpf (ref 0–5)
Specific Gravity, Urine: 1.008 (ref 1.005–1.030)
pH: 6 (ref 5.0–8.0)

## 2017-01-12 LAB — LIPASE, BLOOD: Lipase: <10 U/L — ABNORMAL LOW (ref 11–51)

## 2017-01-12 MED ORDER — KETOROLAC TROMETHAMINE 30 MG/ML IJ SOLN
30.0000 mg | Freq: Once | INTRAMUSCULAR | Status: AC
  Administered 2017-01-12: 30 mg via INTRAVENOUS
  Filled 2017-01-12: qty 1

## 2017-01-12 MED ORDER — SODIUM CHLORIDE 0.9 % IV BOLUS (SEPSIS)
1000.0000 mL | Freq: Once | INTRAVENOUS | Status: AC
  Administered 2017-01-12: 1000 mL via INTRAVENOUS

## 2017-01-12 MED ORDER — ONDANSETRON HCL 4 MG/2ML IJ SOLN
INTRAMUSCULAR | Status: AC
  Filled 2017-01-12: qty 2

## 2017-01-12 MED ORDER — ONDANSETRON HCL 4 MG/2ML IJ SOLN
4.0000 mg | Freq: Once | INTRAMUSCULAR | Status: AC
  Administered 2017-01-12: 4 mg via INTRAVENOUS

## 2017-01-12 NOTE — Discharge Instructions (Signed)
For pain take 600mg  of ibuprofen every 6 hours with meals or snack. Drink plenty of fluids. Take zofran as needed.  You have been seen in the Emergency Department (ED) for abdominal pain.  Your evaluation did not identify a clear cause of your symptoms but was generally reassuring.  Abdominal pain has many possible causes. Some aren't serious and get better on their own in a few days. Others need more testing and treatment. If your pain continues or gets worse, you need to be rechecked and may need more tests to find out what is wrong. You may need surgery to correct the problem.   Follow up with your doctor in 12-24 hours if you are still having abdominal pain. Otherwise follow up in 1-3 days for a re-check  Don't ignore new symptoms, such as fever, nausea and vomiting, new or worsening abdominal pain, urination problems, bloody diarrhea or bloody stools, black tarry stools, uncontrollable nausea and vomiting, and dizziness. These may be signs of a more serious problem. If you develop any of these you should be seen by your doctor immediately or return to the ED.   How can you care for yourself at home?  Rest until you feel better.  To prevent dehydration, drink plenty of fluids, enough so that your urine is light yellow or clear like water. Choose water and other caffeine-free clear liquids until you feel better. If you have kidney, heart, or liver disease and have to limit fluids, talk with your doctor before you increase the amount of fluids you drink.  If your stomach is upset, eat mild foods, such as rice, dry toast or crackers, bananas, and applesauce. Try eating several small meals instead of two or three large ones.  Wait until 48 hours after all symptoms have gone away before you have spicy foods, alcohol, and drinks that contain caffeine.  Do not eat foods that are high in fat.  Avoid anti-inflammatory medicines such as aspirin, ibuprofen (Advil, Motrin), and naproxen (Aleve). These can  cause stomach upset. Talk to your doctor if you take daily aspirin for another health problem.  When should you call for help?  Call 911 anytime you think you may need emergency care. For example, call if:  You passed out (lost consciousness).  You pass maroon or very bloody stools.  You vomit blood or what looks like coffee grounds.  You have new, severe belly pain.  Call your doctor now or seek immediate medical care if:  Your pain gets worse, especially if it becomes focused in one area of your belly.  You have a new or higher fever.  Your stools are black and look like tar, or they have streaks of blood.  You have unexpected vaginal bleeding.  You have symptoms of a urinary tract infection. These may include:  Pain when you urinate.  Urinating more often than usual.  Blood in your urine. You are dizzy or lightheaded, or you feel like you may faint. Watch closely for changes in your health, and be sure to contact your doctor if:  You are not getting better after 1 day (24 hours).

## 2017-01-12 NOTE — ED Provider Notes (Signed)
[facility] Emergency Department Provider Note  ____________________________________________  Time seen: Approximately 12:17 PM  I have reviewed the triage vital signs and the nursing notes.   HISTORY  Chief Complaint Abdominal Pain   HPI Leslie Duran is a 26 y.o. female who presents for evaluation of abdominal pain. Patient was seen here3 days ago with the same pain and was diagnosed with mesenteric adenitis. She reports she continues to have severe pain that she describes as thousand people punching her in her stomach constantly. This pain is constant and diffuse. She also endorses intermittent sharp pain in the right upper quadrant lasting minutes at a time that comes and goes. She has had intermittent nausea. The pain is currently a 9 out of 10. She has had no vomiting, no diarrhea, no constipation, no dysuria or hematuria. Last BM was yesterday. No fever or chills, no vaginal discharge or vaginal bleeding. LMP was 4 years ago. She has an IUD Mirena. No prior abdominal surgeries. Patient tells me that she has been unable to function with her pain. She says that she takes 1 Percocet and is out for 4 hours and when she wakes up the pain is already back. 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 She has not follow-up with her primary care doctor.  Past Medical History:  Diagnosis Date  . Acid reflux   . Constipation   . Depression   . Eye problems   . No pertinent past medical history     Patient Active Problem List   Diagnosis Date Noted  . Encounter for IUD insertion 01/24/2013  . Depression 08/30/2012  . Postpartum depression 08/26/2011    Past Surgical History:  Procedure Laterality Date  . NO PAST SURGERIES      Prior to Admission medications   Medication Sig Start Date End Date Taking? Authorizing Provider  acetaZOLAMIDE (DIAMOX) 500 MG capsule Take 500 mg by mouth 2 (two) times daily.   Yes Historical  Provider, MD  buPROPion (WELLBUTRIN XL) 150 MG 24 hr tablet Take 150 mg by mouth daily.   Yes Historical Provider, MD  cetirizine (ZYRTEC) 10 MG tablet Take 10 mg by mouth daily.   Yes Historical Provider, MD  HYDROcodone-acetaminophen (NORCO/VICODIN) 5-325 MG tablet Take 1 tablet by mouth every 4 (four) hours as needed. 01/09/17  Yes Minna AntisKevin Paduchowski, MD  levonorgestrel (MIRENA) 20 MCG/24HR IUD 1 Intra Uterine Device (1 each total) by Intrauterine route once. Will place in clinic 01/24/13  Yes Reva Boresanya S Pratt, MD  ondansetron (ZOFRAN ODT) 4 MG disintegrating tablet Take 1 tablet (4 mg total) by mouth every 8 (eight) hours as needed for nausea or vomiting. 01/09/17  Yes Minna AntisKevin Paduchowski, MD  pantoprazole (PROTONIX) 40 MG tablet Take 40 mg by mouth daily.   Yes Historical Provider, MD  FLUoxetine (PROZAC) 20 MG capsule Take 1 capsule (20 mg total) by mouth daily after breakfast. Patient not taking: Reported on 01/09/2017 09/05/12 05/09/15  Reva Boresanya S Pratt, MD  traMADol (ULTRAM) 50 MG tablet Take 1 tablet (50 mg total) by mouth every 6 (six) hours as needed for moderate pain. Patient not taking: Reported on 01/09/2017 03/14/16   Joni Reiningonald K Smith, PA-C    Allergies Tomato  Family History  Problem Relation Age of Onset  . Diabetes Maternal Grandmother   . Heart disease Maternal Grandmother   . Stroke Maternal Grandmother     Social History Social History  Substance Use Topics  .  Smoking status: Never Smoker  . Smokeless tobacco: Never Used     Comment: vapes  . Alcohol use No    Review of Systems  Constitutional: Negative for fever. Eyes: Negative for visual changes. ENT: Negative for sore throat. Neck: No neck pain  Cardiovascular: Negative for chest pain. Respiratory: Negative for shortness of breath. Gastrointestinal: + diffuse abdominal pain and nausea. No vomiting or diarrhea. Genitourinary: Negative for dysuria. Musculoskeletal: Negative for back pain. Skin: Negative for  rash. Neurological: Negative for headaches, weakness or numbness. Psych: No SI or HI  ____________________________________________   PHYSICAL EXAM:  VITAL SIGNS: ED Triage Vitals [01/12/17 1109]  Enc Vitals Group     BP 100/87     Pulse Rate 74     Resp 18     Temp 98.2 F (36.8 C)     Temp Source Oral     SpO2 95 %     Weight      Height 5\' 4"  (1.626 m)     Head Circumference      Peak Flow      Pain Score 9     Pain Loc      Pain Edu?      Excl. in GC?     Constitutional: Alert and oriented. Well appearing and in no apparent distress. HEENT:      Head: Normocephalic and atraumatic.         Eyes: Conjunctivae are normal. Sclera is non-icteric. EOMI. PERRL      Mouth/Throat: Mucous membranes are moist.       Neck: Supple with no signs of meningismus. Cardiovascular: Regular rate and rhythm. No murmurs, gallops, or rubs. 2+ symmetrical distal pulses are present in all extremities. No JVD. Respiratory: Normal respiratory effort. Lungs are clear to auscultation bilaterally. No wheezes, crackles, or rhonchi.  Gastrointestinal: Obese, patient grimaces and cries in excruciating pain as I barely touch the skin of her abdomen, she is distractible on my exam if I am talking to her and addressing the reason why she is crying. It seems that she is mostly tender on the epigastric and L quadrants . No rebound or guarding. She denies ttp over the RUQ and RLQ Genitourinary: No CVA tenderness. Musculoskeletal: Nontender with normal range of motion in all extremities. No edema, cyanosis, or erythema of extremities. Neurologic: Normal speech and language. Face is symmetric. Moving all extremities. No gross focal neurologic deficits are appreciated. Skin: Skin is warm, dry and intact. No rash noted. Psychiatric: Mood and affect are normal. Speech and behavior are normal.  ____________________________________________   LABS (all labs ordered are listed, but only abnormal results are  displayed)  Labs Reviewed  LIPASE, BLOOD - Abnormal; Notable for the following:       Result Value   Lipase <10 (*)    All other components within normal limits  COMPREHENSIVE METABOLIC PANEL - Abnormal; Notable for the following:    CO2 20 (*)    Glucose, Bld 109 (*)    Alkaline Phosphatase 36 (*)    All other components within normal limits  CBC - Abnormal; Notable for the following:    Platelets 444 (*)    All other components within normal limits  URINALYSIS, COMPLETE (UACMP) WITH MICROSCOPIC - Abnormal; Notable for the following:    Color, Urine YELLOW (*)    APPearance CLEAR (*)    Squamous Epithelial / LPF 0-5 (*)    All other components within normal limits  POC URINE PREG, ED  POCT PREGNANCY, URINE   ____________________________________________  EKG  none ____________________________________________  RADIOLOGY  RUQ [place]: negative ____________________________________________   PROCEDURES  Procedure(s) performed: None Procedures Critical Care performed:  None ____________________________________________   INITIAL IMPRESSION / ASSESSMENT AND PLAN / ED COURSE  26 y.o. female who presents for evaluation of diffuse abdominal pain and nausea which has been unchanged since patient was seen here 3 days ago. At that time patient had blood work and a CT abdomen and pelvis showing mesenteric adenitis. Patient has been able to tolerate by mouth, she has no other symptoms and the pain is unchanged. Her vital signs are within normal limits, she is well appearing in no distress, patient responds with crying and screaming at the slight attempt of touching her abdomen however she is easily distracted and it seems to have tenderness in the left quadrant and epigastric region. The CT showed evidence of cholelithiasis therefore will pursue a right upper quadrant ultrasound to rule out cholecystitis. Remaining of her blood work at this time is within normal limits and therefore I do  not believe patient warrants repeat CAT scan at this time. She was given Toradol and fluids for her pain.  Clinical Course as of Jan 12 1499  Tue Jan 12, 2017  1455 Patient reports that she feels markedly improved. No longer having abdominal pain. Repeat abdominal exam with no tenderness. Right upper quadrant ultrasound showing cholelithiasis with no evidence of cholecystitis. Blood work within normal limits. I discussed with the patient that I don't believe a repeat CT scan is indicated at this time with normal blood work and pain that has now resolved with IV Toradol. Patient is in agreement. We'll refer patient to [facility] clinic for PCP f/u. I discussed return precautions with patient.  [CV]    Clinical Course User Index [CV] Nita Sickle, MD    Pertinent labs & imaging results that were available during my care of the patient were reviewed by me and considered in my medical decision making (see chart for details).    ____________________________________________   FINAL CLINICAL IMPRESSION(S) / ED DIAGNOSES  Final diagnoses:  RUQ abdominal pain  Mesenteric adenitis  Calculus of gallbladder without cholecystitis without obstruction  Generalized abdominal pain      NEW MEDICATIONS STARTED DURING THIS VISIT:  New Prescriptions   No medications on file     Note:  This document was prepared using Dragon voice recognition software and may include unintentional dictation errors.    Nita Sickle, MD 01/12/17 1500

## 2017-01-12 NOTE — ED Notes (Signed)
Patient left for ultrasound.

## 2017-01-12 NOTE — ED Triage Notes (Signed)
Pt to ed with c/o abd pain, reports she was seen here on Sat and dx with "infected lymph nodes in abd" and was referred to GI MD.  States first available appt was mid April.  Pt states she continues in pain.

## 2017-01-12 NOTE — ED Notes (Signed)
Back from ultrasound

## 2017-01-12 NOTE — ED Notes (Signed)
Patient ambulated to and from room commode with a steady gait. 

## 2017-02-04 ENCOUNTER — Other Ambulatory Visit: Payer: Self-pay

## 2017-02-04 ENCOUNTER — Other Ambulatory Visit
Admission: RE | Admit: 2017-02-04 | Discharge: 2017-02-04 | Disposition: A | Discharge status: Home / Self Care | Payer: Medicaid Other | Source: Ambulatory Visit | Attending: Gastroenterology | Admitting: Gastroenterology

## 2017-02-04 ENCOUNTER — Encounter: Payer: Self-pay | Admitting: Gastroenterology

## 2017-02-04 ENCOUNTER — Other Ambulatory Visit: Payer: Self-pay | Admitting: Gastroenterology

## 2017-02-04 ENCOUNTER — Ambulatory Visit (INDEPENDENT_AMBULATORY_CARE_PROVIDER_SITE_OTHER): Payer: Medicaid Other | Admitting: Gastroenterology

## 2017-02-04 ENCOUNTER — Telehealth: Payer: Self-pay | Admitting: Gastroenterology

## 2017-02-04 VITALS — BP 123/86 | HR 76 | Temp 97.7°F | Ht 64.0 in | Wt 292.8 lb

## 2017-02-04 DIAGNOSIS — R109 Unspecified abdominal pain: Secondary | ICD-10-CM

## 2017-02-04 DIAGNOSIS — H9325 Central auditory processing disorder: Secondary | ICD-10-CM | POA: Insufficient documentation

## 2017-02-04 DIAGNOSIS — R590 Localized enlarged lymph nodes: Secondary | ICD-10-CM | POA: Diagnosis not present

## 2017-02-04 DIAGNOSIS — R1084 Generalized abdominal pain: Secondary | ICD-10-CM

## 2017-02-04 DIAGNOSIS — F909 Attention-deficit hyperactivity disorder, unspecified type: Secondary | ICD-10-CM | POA: Insufficient documentation

## 2017-02-04 LAB — CBC WITH DIFFERENTIAL/PLATELET
Basophils Absolute: 0.1 10*3/uL (ref 0–0.1)
Basophils Relative: 1 %
Eosinophils Absolute: 0.2 10*3/uL (ref 0–0.7)
Eosinophils Relative: 2 %
HCT: 44.8 % (ref 35.0–47.0)
Hemoglobin: 14.8 g/dL (ref 12.0–16.0)
Lymphocytes Relative: 21 %
Lymphs Abs: 2.6 10*3/uL (ref 1.0–3.6)
MCH: 29.2 pg (ref 26.0–34.0)
MCHC: 33.1 g/dL (ref 32.0–36.0)
MCV: 88.1 fL (ref 80.0–100.0)
Monocytes Absolute: 0.7 10*3/uL (ref 0.2–0.9)
Monocytes Relative: 6 %
Neutro Abs: 8.6 10*3/uL — ABNORMAL HIGH (ref 1.4–6.5)
Neutrophils Relative %: 70 %
Platelets: 472 10*3/uL — ABNORMAL HIGH (ref 150–440)
RBC: 5.09 MIL/uL (ref 3.80–5.20)
RDW: 13.9 % (ref 11.5–14.5)
WBC: 12.1 10*3/uL — ABNORMAL HIGH (ref 3.6–11.0)

## 2017-02-04 LAB — COMPREHENSIVE METABOLIC PANEL
ALT: 24 U/L (ref 14–54)
AST: 18 U/L (ref 15–41)
Albumin: 4.4 g/dL (ref 3.5–5.0)
Alkaline Phosphatase: 35 U/L — ABNORMAL LOW (ref 38–126)
Anion gap: 7 (ref 5–15)
BUN: 10 mg/dL (ref 6–20)
CO2: 19 mmol/L — ABNORMAL LOW (ref 22–32)
Calcium: 9.4 mg/dL (ref 8.9–10.3)
Chloride: 112 mmol/L — ABNORMAL HIGH (ref 101–111)
Creatinine, Ser: 0.92 mg/dL (ref 0.44–1.00)
GFR calc Af Amer: >60 mL/min (ref >60)
GFR calc non Af Amer: >60 mL/min (ref >60)
Glucose, Bld: 91 mg/dL (ref 65–99)
Potassium: 3.6 mmol/L (ref 3.5–5.1)
Sodium: 138 mmol/L (ref 135–145)
Total Bilirubin: 0.8 mg/dL (ref 0.3–1.2)
Total Protein: 8 g/dL (ref 6.5–8.1)

## 2017-02-04 LAB — C-REACTIVE PROTEIN: CRP: 1.5 mg/dL — ABNORMAL HIGH (ref <1.0)

## 2017-02-04 LAB — SEDIMENTATION RATE: Sed Rate: 49 mm/hr — ABNORMAL HIGH (ref 0–20)

## 2017-02-04 NOTE — Progress Notes (Signed)
Gastroenterology Consultation  Referring Provider:     [facility] [place] Physician:  [place] Primary Gastroenterologist:  Dr. Jonathon Bellows  Reason for Consultation:     Abdominal pain         HPI:   Leslie Duran is a 26 y.o. y/o female referred for consultation & management  by Dr. Princella [facility].    She was seen at the ER on 01/12/17 for abdominal pain . She was noted to have an elevated platelet count .   RUQ USG on 3/201/18 showed a 1.2 cm gall stone without features of gall bladder inflammation.   CT abdomen 01/09/17 showed some enlarged lymph nodes in the RLQ , largest was 10 mm .  Labs 01/12/17 -CMP -normal HB 14.3    Abdominal pain: Onset: about a month back , began initially to one location and has since spread, comes and goes, every other day occurring less often presently, day and night  Site :points to the upper abdomen  Radiation: lower part and to the back  Severity :makes her stop what ever she is doing  Petra Kuba of pain: feels like a knife Aggravating factors: nothing so far  Relieving factors :going to sleep  Weight loss: lost weight 6 lbs over a month  NSAID use: none   Gall bladder surgery: yes  Frequency of bowel movements: daily-soft  Change in bowel movements: used to go three times a day and now is less often  Relief with bowel movements: none  Gas/Bloating/Abdominal distension: burping   No fevers, sweating more at night presently . No vaginal discharge. Grandfather had lung cancr, testicular cancer, uncle had breast cancer, none in first degree relatives   Past Medical History:  Diagnosis Date  . Acid reflux   . Constipation   . Depression   . Eye problems   . No pertinent past medical history     Past Surgical History:  Procedure Laterality Date  . NO PAST SURGERIES      Prior to Admission medications   Medication Sig Start Date End Date Taking? Authorizing Provider  acetaZOLAMIDE (DIAMOX) 500  MG capsule Take 500 mg by mouth 2 (two) times daily.   Yes Historical Provider, MD  buPROPion (WELLBUTRIN XL) 150 MG 24 hr tablet Take 150 mg by mouth daily.   Yes Historical Provider, MD  levonorgestrel (MIRENA) 20 MCG/24HR IUD 1 Intra Uterine Device (1 each total) by Intrauterine route once. Will place in clinic 01/24/13  Yes Donnamae Jude, MD  pantoprazole (PROTONIX) 40 MG tablet Take 40 mg by mouth daily.   Yes Historical Provider, MD  cetirizine (ZYRTEC) 10 MG tablet Take 10 mg by mouth daily.    Historical Provider, MD  FLUoxetine (PROZAC) 20 MG capsule Take 1 capsule (20 mg total) by mouth daily after breakfast. Patient not taking: Reported on 01/09/2017 09/05/12 05/09/15  Donnamae Jude, MD  ondansetron (ZOFRAN ODT) 4 MG disintegrating tablet Take 1 tablet (4 mg total) by mouth every 8 (eight) hours as needed for nausea or vomiting. Patient not taking: Reported on 02/04/2017 01/09/17   Harvest Dark, MD    Family History  Problem Relation Age of Onset  . Diabetes Maternal Grandmother   . Heart disease Maternal Grandmother   . Stroke Maternal Grandmother      Social History  Substance Use Topics  . Smoking status: Former Smoker    Packs/day: 1.00    Types: Cigarettes    Quit date:  12/24/2016  . Smokeless tobacco: Never Used     Comment: vapes  . Alcohol use No    Allergies as of 02/04/2017 - Review Complete 02/04/2017  Allergen Reaction Noted  . Tomato Nausea And Vomiting and Swelling 06/30/2011    Review of Systems:    All systems reviewed and negative except where noted in HPI.   Physical Exam:  BP 123/86 (BP Location: Right Arm, Patient Position: Sitting, Cuff Size: Large)   Pulse 76   Temp 97.7 F (36.5 C) (Oral)   Ht 5' 4"  (1.626 m)   Wt 292 lb 12.8 oz (132.8 kg)   BMI 50.26 kg/m  No LMP recorded. Patient is not currently having periods (Reason: IUD).   I examined the lymph nodes of the groin , axilla with chaperone in the room Panya Psych:  Alert and  cooperative. Normal mood and affect. General:   Alert,  Well-developed, well-nourished, pleasant and cooperative in NAD Head:  Normocephalic and atraumatic. Eyes:  Sclera clear, no icterus.   Conjunctiva pink. Ears:  Normal auditory acuity. Nose:  No deformity, discharge, or lesions. Mouth:  No deformity or lesions,oropharynx pink & moist. Neck:  Supple; no masses or thyromegaly. Lungs:  Respirations even and unlabored.  Clear throughout to auscultation.   No wheezes, crackles, or rhonchi. No acute distress. Heart:  Regular rate and rhythm; no murmurs, clicks, rubs, or gallops. Abdomen:  Normal bowel sounds.  No bruits.  Soft, non-tender and non-distended without masses, hepatosplenomegaly or hernias noted.  No guarding or rebound tenderness.    Msk:  Symmetrical without gross deformities. Good, equal movement & strength bilaterally.. Neurologic:  Alert and oriented x3;  grossly normal neurologically. Skin:  Intact without significant lesions or rashes. No jaundice. Lymph Nodes:  No significant cervical adenopathy. Psych:  Alert and cooperative. Normal mood and affect.  Imaging Studies: Ct Abdomen Pelvis W Contrast  Result Date: 01/09/2017 CLINICAL DATA:  Right mid to lower abdominal pain for the past 3 days. EXAM: CT ABDOMEN AND PELVIS WITH CONTRAST TECHNIQUE: Multidetector CT imaging of the abdomen and pelvis was performed using the standard protocol following bolus administration of intravenous contrast. CONTRAST:  170m ISOVUE-300 IOPAMIDOL (ISOVUE-300) INJECTION 61% COMPARISON:  None. FINDINGS: Lower chest: Clear lung bases. Hepatobiliary: Normal appearing liver. Possible tiny gallstones in the dependent portion of the gallbladder. No gallbladder wall thickening or pericholecystic fluid. Pancreas: Unremarkable. No pancreatic ductal dilatation or surrounding inflammatory changes. Spleen: Normal in size without focal abnormality. Adrenals/Urinary Tract: Adrenal glands are unremarkable. Kidneys  are normal, without renal calculi, focal lesion, or hydronephrosis. Bladder is unremarkable. Stomach/Bowel: Stomach is within normal limits. Appendix appears normal. No evidence of bowel wall thickening, distention, or inflammatory changes. Vascular/Lymphatic: Mildly enlarged right lower quadrant abdominal mesenteric lymph nodes. The largest has a short axis diameter of 10 mm on image number 50 of series 2. No vascular abnormalities. Reproductive: Intrauterine device in expected position in the uterus. Normal appearing ovaries. Other: No free peritoneal fluid or air. Small umbilical hernia containing fat. Musculoskeletal: Normal appearing bones. IMPRESSION: 1. Right lower quadrant mesenteric adenitis. 2. Normal appendix. 3. Probable cholelithiasis without evidence cholecystitis. 4. Small umbilical hernia containing fat. Electronically Signed   By: SClaudie ReveringM.D.   On: 01/09/2017 18:04   [place]dom[place] Limited Ruq  Result Date: 01/12/2017 CLINICAL DATA:  26year old female with right abdominal pain for the past 3 days. Subsequent encounter. EXAM: [place] LIMITED - RIGHT UPPER QUADRANT COMPARISON:  01/09/2017 CT. FINDINGS: Gallbladder: 1.2 cm gallstone.  No gallbladder wall thickening or pericholecystic fluid. Per ultrasound technologist, patient was not tender over this region during scanning. Common bile duct: Diameter: 4.4 mm. Liver: No focal lesion identified. Within normal limits in parenchymal echogenicity. IMPRESSION: 1.2 cm gallstone without findings to suggest gallbladder inflammation. Electronically Signed   By: Genia Del M.D.   On: 01/12/2017 14:33    Assessment and Plan:   Leslie Duran is a 26 y.o. y/o female has been referred for abdominal pain and mesenteric adenitis seen on recent CT scan. She does mention the pain is getting better. She does have some "B" symptoms per her history. It is very likely she could have had a viral illness with adenopathy which is resolving but I would like  to ensure that the adenoapthy is resolving with a CT scan . If not will need further evaluation. I will also obtain a CBC,BMP,ESR,CRP today   Follow up in 3-4 weeks   Dr Jonathon Bellows MD

## 2017-02-04 NOTE — Addendum Note (Signed)
Addended by: Wyline Mood on: 02/04/2017 02:37 PM   Modules accepted: Orders

## 2017-02-04 NOTE — Telephone Encounter (Signed)
Judeth Cornfield in the CT dept said patient needs a pregnancy test before CT. Please contact the patient.

## 2017-02-05 ENCOUNTER — Telehealth: Payer: Self-pay

## 2017-02-05 ENCOUNTER — Other Ambulatory Visit: Payer: Self-pay

## 2017-02-05 ENCOUNTER — Other Ambulatory Visit
Admission: RE | Admit: 2017-02-05 | Discharge: 2017-02-05 | Disposition: A | Discharge status: Home / Self Care | Payer: Medicaid Other | Source: Ambulatory Visit | Attending: Gastroenterology | Admitting: Gastroenterology

## 2017-02-05 DIAGNOSIS — R109 Unspecified abdominal pain: Secondary | ICD-10-CM | POA: Diagnosis present

## 2017-02-05 LAB — PREGNANCY, URINE: Preg Test, Ur: NEGATIVE

## 2017-02-05 NOTE — Telephone Encounter (Signed)
Advised patient that urine pregnancy test was due prior to CT on 4/20

## 2017-02-11 ENCOUNTER — Telehealth: Payer: Self-pay | Admitting: Gastroenterology

## 2017-02-11 NOTE — Telephone Encounter (Signed)
02/11/17 Spoke with Erie Noe at [place] ([facility]) [phone] and prior Berkley Harvey is required case # [id number] and clinicals are to be faxed to [phone].

## 2017-02-11 NOTE — Telephone Encounter (Signed)
02/11/17 Patient has MCD and NO prior auth required.

## 2017-02-12 ENCOUNTER — Ambulatory Visit: Admission: RE | Admit: 2017-02-12 | Payer: Medicaid Other | Source: Ambulatory Visit

## 2017-02-19 ENCOUNTER — Ambulatory Visit
Admission: RE | Admit: 2017-02-19 | Discharge: 2017-02-19 | Disposition: A | Discharge status: Home / Self Care | Payer: Medicaid Other | Source: Ambulatory Visit | Attending: Gastroenterology | Admitting: Gastroenterology

## 2017-02-19 DIAGNOSIS — R109 Unspecified abdominal pain: Secondary | ICD-10-CM

## 2017-02-19 DIAGNOSIS — K429 Umbilical hernia without obstruction or gangrene: Secondary | ICD-10-CM | POA: Diagnosis not present

## 2017-02-19 DIAGNOSIS — K8020 Calculus of gallbladder without cholecystitis without obstruction: Secondary | ICD-10-CM | POA: Diagnosis not present

## 2017-02-19 DIAGNOSIS — R590 Localized enlarged lymph nodes: Secondary | ICD-10-CM | POA: Insufficient documentation

## 2017-02-19 DIAGNOSIS — Z975 Presence of (intrauterine) contraceptive device: Secondary | ICD-10-CM | POA: Insufficient documentation

## 2017-02-19 MED ORDER — IOPAMIDOL (ISOVUE-370) INJECTION 76%
125.0000 mL | Freq: Once | INTRAVENOUS | Status: AC | PRN
  Administered 2017-02-19: 125 mL via INTRAVENOUS

## 2017-03-11 ENCOUNTER — Ambulatory Visit (INDEPENDENT_AMBULATORY_CARE_PROVIDER_SITE_OTHER): Payer: Medicaid Other | Admitting: Gastroenterology

## 2017-03-11 ENCOUNTER — Encounter: Payer: Self-pay | Admitting: Gastroenterology

## 2017-03-11 VITALS — BP 114/78 | HR 85 | Temp 97.7°F | Ht 64.0 in | Wt 296.4 lb

## 2017-03-11 DIAGNOSIS — K429 Umbilical hernia without obstruction or gangrene: Secondary | ICD-10-CM | POA: Diagnosis not present

## 2017-03-11 DIAGNOSIS — R590 Localized enlarged lymph nodes: Secondary | ICD-10-CM | POA: Diagnosis not present

## 2017-03-11 NOTE — Progress Notes (Signed)
Primary Care Physician: [facility]  Primary Gastroenterologist:  Dr. Jonathon Bellows   Chief Complaint  Patient presents with  . Abdominal Pain    HPI: Leslie Duran is a 26 y.o. female   She is here today to follow up to her last visit on 02/04/17 .   Summary of history :  She was seen at the ER on 01/12/17 for abdominal pain . She was noted to have an elevated platelet count . RUQ USG on 3/201/18 showed a 1.2 cm gall stone without features of gall bladder inflammation. CT abdomen 01/09/17 showed some enlarged lymph nodes in the RLQ , largest was 10 mm .The pain began in 12/2016 , upper abdomen , like a knife , no aggrevating factors, had lost 6 lbs weight , no nsaid use, burping ,   Labs 01/12/17 -CMP -normal HB 14.3    Interval history   02/04/2017-  03/11/2017   WCC 12.1, HB 14.8 ,LFT's were normal , ESR 49 and CRP 1.5 Repeat CT scan of the abdomen shows lymphnodes that are smaller than previously on 02/19/17 , also a small umbilical hernia was noted . Gained 4 lbs .Overall pain abdomen has improved. No fevers   Current Outpatient Prescriptions  Medication Sig Dispense Refill  . acetaZOLAMIDE (DIAMOX) 500 MG capsule Take 500 mg by mouth 2 (two) times daily.    Marland Kitchen buPROPion (WELLBUTRIN XL) 150 MG 24 hr tablet Take 150 mg by mouth daily.    . cetirizine (ZYRTEC) 10 MG tablet Take 10 mg by mouth daily.    Marland Kitchen levonorgestrel (MIRENA) 20 MCG/24HR IUD 1 Intra Uterine Device (1 each total) by Intrauterine route once. Will place in clinic 1 each 0  . ondansetron (ZOFRAN ODT) 4 MG disintegrating tablet Take 1 tablet (4 mg total) by mouth every 8 (eight) hours as needed for nausea or vomiting. 20 tablet 0  . pantoprazole (PROTONIX) 40 MG tablet Take 40 mg by mouth daily.    Marland Kitchen FLUoxetine (PROZAC) 20 MG capsule Take 1 capsule (20 mg total) by mouth daily after breakfast. (Patient not taking: Reported on 01/09/2017) 30 capsule 11   No current facility-administered  medications for this visit.     Allergies as of 03/11/2017 - Review Complete 03/11/2017  Allergen Reaction Noted  . Tomato Nausea And Vomiting and Swelling 06/30/2011    ROS:  General: Negative for anorexia, weight loss, fever, chills, fatigue, weakness. ENT: Negative for hoarseness, difficulty swallowing , nasal congestion. CV: Negative for chest pain, angina, palpitations, dyspnea on exertion, peripheral edema.  Respiratory: Negative for dyspnea at rest, dyspnea on exertion, cough, sputum, wheezing.  GI: See history of present illness. GU:  Negative for dysuria, hematuria, urinary incontinence, urinary frequency, nocturnal urination.  Endo: Negative for unusual weight change.    Physical Examination:   BP 114/78 (BP Location: Left Arm, Patient Position: Sitting, Cuff Size: Large)   Pulse 85   Temp 97.7 F (36.5 C) (Oral)   Ht 5' 4"  (1.626 m)   Wt 296 lb 6.4 oz (134.4 kg)   BMI 50.88 kg/m   General: Well-nourished, well-developed in no acute distress,obese   Eyes: No icterus. Conjunctivae pink. Mouth: Oropharyngeal mucosa moist and pink , no lesions erythema or exudate. Lungs: Clear to auscultation bilaterally. Non-labored. Heart: Regular rate and rhythm, no murmurs rubs or gallops.  Abdomen: small reducible umbilical hernia, Bowel sounds are normal, nontender, nondistended, no hepatosplenomegaly or masses, no abdominal bruits or hernia , no rebound or  guarding.   Extremities: No lower extremity edema. No clubbing or deformities. Neuro: Alert and oriented x 3.  Grossly intact. Skin: Warm and dry, no jaundice.   Psych: Alert and cooperative, normal mood and affect.   Imaging Studies: Ct Abdomen Pelvis W Contrast  Result Date: 02/19/2017 CLINICAL DATA:  Right-sided abdominal pain for several months EXAM: CT ABDOMEN AND PELVIS WITH CONTRAST TECHNIQUE: Multidetector CT imaging of the abdomen and pelvis was performed using the standard protocol following bolus administration  of intravenous contrast. CONTRAST:  125 mL Isovue 370. COMPARISON:  01/09/2017, 01/12/2017 FINDINGS: Lower chest: No acute abnormality. Hepatobiliary: Small gallstone is noted within gallbladder. No gallbladder wall thickening or pericholecystic fluid is noted. The liver is within normal limits. Pancreas: Unremarkable. No pancreatic ductal dilatation or surrounding inflammatory changes. Spleen: Normal in size without focal abnormality. Adrenals/Urinary Tract: Adrenal glands are unremarkable. Kidneys are normal, without renal calculi, focal lesion, or hydronephrosis. Bladder is unremarkable. Stomach/Bowel: The appendix is within normal limits. No obstructive changes are seen. No inflammatory changes are noted. Vascular/Lymphatic: Scattered small lymph nodes are again identified in the right lower quadrant adjacent to the cecum. There slightly smaller than that seen on the prior exam. No new significant lymphadenopathy is noted. All vascularity is within normal limits. Reproductive: IUD is noted within the uterus. Mild cystic changes are noted the ovaries bilaterally. Other: Small fat containing umbilical hernia is again seen. Musculoskeletal: No acute or significant osseous findings. IMPRESSION: Stable cholelithiasis. Small lymph nodes in the right lower quadrant slightly smaller than that seen on the prior exam. No new acute abnormality is noted. Electronically Signed   By: Inez Catalina M.D.   On: 02/19/2017 13:50    Assessment and Plan:   Leslie Duran is a 26 y.o. y/o female here to follow up for  abdominal pain and mesenteric adenitis seen on recent CT scan which is improving on repeat CT scan but not yet resolved very likely of viral etiology . Her weight since last time has remained stable rather gained 4 lbs . Abdominal pain frequency and intensity has improved.    Plan  1. Repeat CT scan in 3 months to check for resolution of the lymphadenopathy 2. Small reducible umbilical hernia- suggested  surgery referral , she will call when she is ready for an appointment   Dr Jonathon Bellows  MD Follow up in 3 months

## 2017-06-01 ENCOUNTER — Telehealth: Payer: Self-pay

## 2017-06-01 ENCOUNTER — Other Ambulatory Visit: Payer: Self-pay

## 2017-06-01 DIAGNOSIS — R590 Localized enlarged lymph nodes: Secondary | ICD-10-CM

## 2017-06-01 DIAGNOSIS — R1011 Right upper quadrant pain: Secondary | ICD-10-CM

## 2017-06-01 DIAGNOSIS — Z01812 Encounter for preprocedural laboratory examination: Secondary | ICD-10-CM

## 2017-06-01 NOTE — Telephone Encounter (Signed)
Advised patient of repeat CT Scan needed to verify resolution of lymphadenopathy.  Ordered pregnancy test.  Advised patient to pickup contrast 1 day prior.  Test: Friday: 9/17 @ 915am @ [facility].  NPO 4 hours

## 2017-06-11 ENCOUNTER — Ambulatory Visit: Admission: RE | Admit: 2017-06-11 | Payer: Medicaid Other | Source: Ambulatory Visit

## 2017-07-16 ENCOUNTER — Ambulatory Visit: Payer: Self-pay | Admitting: Obstetrics & Gynecology

## 2017-08-02 ENCOUNTER — Encounter: Payer: Self-pay | Admitting: Obstetrics & Gynecology

## 2017-08-02 ENCOUNTER — Ambulatory Visit (INDEPENDENT_AMBULATORY_CARE_PROVIDER_SITE_OTHER): Payer: Medicaid Other | Admitting: Obstetrics & Gynecology

## 2017-08-02 ENCOUNTER — Telehealth: Payer: Self-pay

## 2017-08-02 ENCOUNTER — Other Ambulatory Visit (HOSPITAL_COMMUNITY)
Admission: RE | Admit: 2017-08-02 | Discharge: 2017-08-02 | Disposition: A | Discharge status: Home / Self Care | Payer: Medicaid Other | Source: Ambulatory Visit | Attending: Obstetrics & Gynecology | Admitting: Obstetrics & Gynecology

## 2017-08-02 VITALS — BP 115/70 | HR 72 | Wt 281.0 lb

## 2017-08-02 DIAGNOSIS — Z309 Encounter for contraceptive management, unspecified: Secondary | ICD-10-CM | POA: Diagnosis not present

## 2017-08-02 DIAGNOSIS — N760 Acute vaginitis: Secondary | ICD-10-CM

## 2017-08-02 DIAGNOSIS — Z01419 Encounter for gynecological examination (general) (routine) without abnormal findings: Secondary | ICD-10-CM | POA: Insufficient documentation

## 2017-08-02 DIAGNOSIS — B9689 Other specified bacterial agents as the cause of diseases classified elsewhere: Secondary | ICD-10-CM

## 2017-08-02 DIAGNOSIS — Z30431 Encounter for routine checking of intrauterine contraceptive device: Secondary | ICD-10-CM

## 2017-08-02 NOTE — Progress Notes (Signed)
GYNECOLOGY ANNUAL PREVENTATIVE CARE ENCOUNTER NOTE  Subjective:   Leslie Duran is a 26 y.o. G19P2002 female here for a routine annual gynecologic exam.  Current complaints: desires IUD removal.   Denies abnormal vaginal bleeding, discharge, pelvic pain, problems with intercourse or other gynecologic concerns.    Gynecologic History No LMP recorded. Patient is not currently having periods (Reason: IUD). Contraception: IUD Last Pap: 2013. Results were: normal  Obstetric History OB History  Gravida Para Term Preterm AB Living  SAB TAB Ectopic Multiple Live Births          2    # Outcome Date GA Lbr Len/2nd Weight Sex Delivery Anes PTL Lv  2 Term 11/29/12 [redacted]w[redacted]d 11:32 / 00:35 9 lb 9.4 oz (4.349 kg) M Vag-Spont EPI  LIV  1 Term 07/15/11 [redacted]w[redacted]d 30:30 / 00:27 7 lb 5.3 oz (3.325 kg) F Vag-Spont EPI  LIV      Past Medical History:  Diagnosis Date  . Acid reflux   . Constipation   . Depression   . Eye problems   . No pertinent past medical history     Past Surgical History:  Procedure Laterality Date  . NO PAST SURGERIES      Current Outpatient Prescriptions on File Prior to Visit  Medication Sig Dispense Refill  . cetirizine (ZYRTEC) 10 MG tablet Take 10 mg by mouth daily.    Marland Kitchen levonorgestrel (MIRENA) 20 MCG/24HR IUD 1 Intra Uterine Device (1 each total) by Intrauterine route once. Will place in clinic 1 each 0  . ondansetron (ZOFRAN ODT) 4 MG disintegrating tablet Take 1 tablet (4 mg total) by mouth every 8 (eight) hours as needed for nausea or vomiting. 20 tablet 0  . pantoprazole (PROTONIX) 40 MG tablet Take 40 mg by mouth daily.    Marland Kitchen acetaZOLAMIDE (DIAMOX) 500 MG capsule Take 500 mg by mouth 2 (two) times daily.    Marland Kitchen buPROPion (WELLBUTRIN XL) 150 MG 24 hr tablet Take 150 mg by mouth daily.    Marland Kitchen FLUoxetine (PROZAC) 20 MG capsule Take 1 capsule (20 mg total) by mouth daily after breakfast. (Patient not taking: Reported on 01/09/2017) 30 capsule 11   No  current facility-administered medications on file prior to visit.     Allergies  Allergen Reactions  . Tomato Nausea And Vomiting and Swelling    Throat swelling    Social History   Social History  . Marital status: Married    Spouse name: N/A  . Number of children: N/A  . Years of education: N/A   Occupational History  . Not on file.   Social History Main Topics  . Smoking status: Former Smoker    Packs/day: 1.00    Types: Cigarettes    Quit date: 12/24/2016  . Smokeless tobacco: Never Used     Comment: vapes  . Alcohol use No  . Drug use: No  . Sexual activity: Yes    Birth control/ protection: IUD   Other Topics Concern  . Not on file   Social History Narrative  . No narrative on file    Family History  Problem Relation Age of Onset  . Diabetes Maternal Grandmother   . Heart disease Maternal Grandmother   . Stroke Maternal Grandmother     The following portions of the patient's history were reviewed and updated as appropriate: allergies, current medications, past family history, past medical history, past social history, past surgical  history and problem list.  Review of Systems Pertinent items noted in HPI and remainder of comprehensive ROS otherwise negative.   Objective:  BP 115/70   Pulse 72   Wt 281 lb (127.5 kg)   BMI 48.23 kg/m  CONSTITUTIONAL: Well-developed, well-nourished female in no acute distress.  HENT:  Normocephalic, atraumatic, External right and left ear normal. Oropharynx is clear and moist EYES: Conjunctivae and EOM are normal. Pupils are equal, round, and reactive to light. No scleral icterus.  NECK: Normal range of motion, supple, no masses.  Normal thyroid.  SKIN: Skin is warm and dry. No rash noted. Not diaphoretic. No erythema. No pallor. NEUROLOGIC: Alert and oriented to person, place, and time. Normal reflexes, muscle tone coordination. No cranial nerve deficit noted. PSYCHIATRIC: Normal mood and affect. Normal behavior.  Normal judgment and thought content. CARDIOVASCULAR: Normal heart rate noted, regular rhythm RESPIRATORY: Clear to auscultation bilaterally. Effort and breath sounds normal, no problems with respiration noted. BREASTS: Symmetric in size. No masses, skin changes, nipple drainage, or lymphadenopathy. ABDOMEN: Soft, obese, normal bowel sounds, no distention appreciated.  No tenderness, rebound or guarding.  PELVIC: Normal appearing external genitalia; normal appearing vaginal mucosa and cervix.  Normal appearing discharge.  Pap smear obtained.  Unable to palpate uterus or adnexa secondary to habitus.  MUSCULOSKELETAL: Normal range of motion. No tenderness.  No cyanosis, clubbing, or edema.  2+ distal pulses.   Assessment and Plan:  1. IUD check up Will return for removal if desired.  2. Encounter for gynecological examination with Papanicolaou smear of cervix - Cytology - PAP Will follow up results of pap smear and manage accordingly. Routine preventative health maintenance measures emphasized. Please refer to After Visit Summary for other counseling recommendations.    Jaynie Collins, MD, FACOG Attending Obstetrician & Gynecologist, [facility]

## 2017-08-02 NOTE — Telephone Encounter (Signed)
ENTERED IN ERROR

## 2017-08-02 NOTE — Patient Instructions (Signed)
Preventive Care 18-39 Years, Female Preventive care refers to lifestyle choices and visits with your health care provider that can promote health and wellness. What does preventive care include?  A yearly physical exam. This is also called an annual well check.  Dental exams once or twice a year.  Routine eye exams. Ask your health care provider how often you should have your eyes checked.  Personal lifestyle choices, including: ? Daily care of your teeth and gums. ? Regular physical activity. ? Eating a healthy diet. ? Avoiding tobacco and drug use. ? Limiting alcohol use. ? Practicing safe sex. ? Taking vitamin and mineral supplements as recommended by your health care provider. What happens during an annual well check? The services and screenings done by your health care provider during your annual well check will depend on your age, overall health, lifestyle risk factors, and family history of disease. Counseling Your health care provider may ask you questions about your:  Alcohol use.  Tobacco use.  Drug use.  Emotional well-being.  Home and relationship well-being.  Sexual activity.  Eating habits.  Work and work Statistician.  Method of birth control.  Menstrual cycle.  Pregnancy history.  Screening You may have the following tests or measurements:  Height, weight, and BMI.  Diabetes screening. This is done by checking your blood sugar (glucose) after you have not eaten for a while (fasting).  Blood pressure.  Lipid and cholesterol levels. These may be checked every 5 years starting at age 66.  Skin check.  Hepatitis C blood test.  Hepatitis B blood test.  Sexually transmitted disease (STD) testing.  BRCA-related cancer screening. This may be done if you have a family history of breast, ovarian, tubal, or peritoneal cancers.  Pelvic exam and Pap test. This may be done every 3 years starting at age 40. Starting at age 59, this may be done every 5  years if you have a Pap test in combination with an HPV test.  Discuss your test results, treatment options, and if necessary, the need for more tests with your health care provider. Vaccines Your health care provider may recommend certain vaccines, such as:  Influenza vaccine. This is recommended every year.  Tetanus, diphtheria, and acellular pertussis (Tdap, Td) vaccine. You may need a Td booster every 10 years.  Varicella vaccine. You may need this if you have not been vaccinated.  HPV vaccine. If you are 69 or younger, you may need three doses over 6 months.  Measles, mumps, and rubella (MMR) vaccine. You may need at least one dose of MMR. You may also need a second dose.  Pneumococcal 13-valent conjugate (PCV13) vaccine. You may need this if you have certain conditions and were not previously vaccinated.  Pneumococcal polysaccharide (PPSV23) vaccine. You may need one or two doses if you smoke cigarettes or if you have certain conditions.  Meningococcal vaccine. One dose is recommended if you are age 27-21 years and a first-year college student living in a residence hall, or if you have one of several medical conditions. You may also need additional booster doses.  Hepatitis A vaccine. You may need this if you have certain conditions or if you travel or work in places where you may be exposed to hepatitis A.  Hepatitis B vaccine. You may need this if you have certain conditions or if you travel or work in places where you may be exposed to hepatitis B.  Haemophilus influenzae type b (Hib) vaccine. You may need this if  you have certain risk factors.  Talk to your health care provider about which screenings and vaccines you need and how often you need them. This information is not intended to replace advice given to you by your health care provider. Make sure you discuss any questions you have with your health care provider. Document Released: 12/08/2001 Document Revised: 07/01/2016  Document Reviewed: 08/13/2015 Elsevier Interactive Patient Education  2017 [employer].

## 2017-08-09 LAB — CYTOLOGY - PAP
Diagnosis: UNDETERMINED — AB
HPV: NOT DETECTED

## 2017-08-10 ENCOUNTER — Ambulatory Visit: Payer: Medicaid Other | Admitting: Obstetrics & Gynecology

## 2017-08-10 MED ORDER — METRONIDAZOLE 500 MG PO TABS: 500.0000 mg | ORAL_TABLET | Freq: Two times a day (BID) | ORAL | 0 refills | Status: DC

## 2017-08-10 NOTE — Addendum Note (Signed)
Addended by: Jaynie Collins A on: 08/10/2017 11:16 AM   Modules accepted: Orders

## 2017-08-17 ENCOUNTER — Ambulatory Visit: Payer: Medicaid Other | Admitting: Obstetrics & Gynecology

## 2017-11-03 ENCOUNTER — Emergency Department
Admission: EM | Admit: 2017-11-03 | Discharge: 2017-11-03 | Disposition: A | Discharge status: Home / Self Care | Payer: Medicaid Other | Attending: Emergency Medicine | Admitting: Emergency Medicine

## 2017-11-03 ENCOUNTER — Other Ambulatory Visit: Payer: Self-pay

## 2017-11-03 ENCOUNTER — Encounter: Payer: Self-pay | Admitting: Emergency Medicine

## 2017-11-03 DIAGNOSIS — K2900 Acute gastritis without bleeding: Secondary | ICD-10-CM | POA: Diagnosis not present

## 2017-11-03 DIAGNOSIS — Z79899 Other long term (current) drug therapy: Secondary | ICD-10-CM | POA: Diagnosis not present

## 2017-11-03 DIAGNOSIS — Z87891 Personal history of nicotine dependence: Secondary | ICD-10-CM | POA: Diagnosis not present

## 2017-11-03 DIAGNOSIS — R1084 Generalized abdominal pain: Secondary | ICD-10-CM | POA: Diagnosis present

## 2017-11-03 DIAGNOSIS — R109 Unspecified abdominal pain: Secondary | ICD-10-CM

## 2017-11-03 LAB — URINALYSIS, COMPLETE (UACMP) WITH MICROSCOPIC
Bacteria, UA: NONE SEEN
Bilirubin Urine: NEGATIVE
Glucose, UA: NEGATIVE mg/dL
Hgb urine dipstick: NEGATIVE
Ketones, ur: NEGATIVE mg/dL
Leukocytes, UA: NEGATIVE
Nitrite: NEGATIVE
Protein, ur: NEGATIVE mg/dL
RBC / HPF: NONE SEEN RBC/hpf (ref 0–5)
Specific Gravity, Urine: 1.018 (ref 1.005–1.030)
pH: 5 (ref 5.0–8.0)

## 2017-11-03 LAB — COMPREHENSIVE METABOLIC PANEL
ALT: 23 U/L (ref 14–54)
AST: 21 U/L (ref 15–41)
Albumin: 4.1 g/dL (ref 3.5–5.0)
Alkaline Phosphatase: 35 U/L — ABNORMAL LOW (ref 38–126)
Anion gap: 7 (ref 5–15)
BUN: 18 mg/dL (ref 6–20)
CO2: 25 mmol/L (ref 22–32)
Calcium: 9.3 mg/dL (ref 8.9–10.3)
Chloride: 103 mmol/L (ref 101–111)
Creatinine, Ser: 0.97 mg/dL (ref 0.44–1.00)
GFR calc Af Amer: >60 mL/min (ref >60)
GFR calc non Af Amer: >60 mL/min (ref >60)
Glucose, Bld: 117 mg/dL — ABNORMAL HIGH (ref 65–99)
Potassium: 4 mmol/L (ref 3.5–5.1)
Sodium: 135 mmol/L (ref 135–145)
Total Bilirubin: 0.9 mg/dL (ref 0.3–1.2)
Total Protein: 7.7 g/dL (ref 6.5–8.1)

## 2017-11-03 LAB — CBC
HCT: 46.1 % (ref 35.0–47.0)
Hemoglobin: 15.9 g/dL (ref 12.0–16.0)
MCH: 30.7 pg (ref 26.0–34.0)
MCHC: 34.4 g/dL (ref 32.0–36.0)
MCV: 89.4 fL (ref 80.0–100.0)
Platelets: 434 10*3/uL (ref 150–440)
RBC: 5.16 MIL/uL (ref 3.80–5.20)
RDW: 13.3 % (ref 11.5–14.5)
WBC: 10.8 10*3/uL (ref 3.6–11.0)

## 2017-11-03 LAB — POCT PREGNANCY, URINE: Preg Test, Ur: NEGATIVE

## 2017-11-03 LAB — LIPASE, BLOOD: Lipase: 23 U/L (ref 11–51)

## 2017-11-03 MED ORDER — GI COCKTAIL ~~LOC~~
ORAL | Status: AC
  Filled 2017-11-03: qty 30

## 2017-11-03 MED ORDER — SUCRALFATE 1 G PO TABS: 1.0000 g | ORAL_TABLET | Freq: Four times a day (QID) | ORAL | 0 refills | Status: AC

## 2017-11-03 MED ORDER — FAMOTIDINE 40 MG PO TABS: 40.0000 mg | ORAL_TABLET | Freq: Every evening | ORAL | 1 refills | Status: DC

## 2017-11-03 MED ORDER — GI COCKTAIL ~~LOC~~
30.0000 mL | Freq: Once | ORAL | Status: AC
  Administered 2017-11-03: 30 mL via ORAL

## 2017-11-03 NOTE — ED Notes (Signed)
Pt reports severe upper/mid abd pain - pain started 3 days ago - reports nausea - reports diarrhea (12 loose stools in 24 hours) - denies vomiting

## 2017-11-03 NOTE — ED Provider Notes (Signed)
[facility] Emergency Department Provider Note   ____________________________________________   I have reviewed the triage vital signs and the nursing notes.   HISTORY  Chief Complaint Abdominal Pain   History limited by: Not Limited   HPI Leslie Duran is a 27 y.o. female who presents to the emergency department today because of concern for abdominal pain.   LOCATION:epigastric, LUQ DURATION:3 days TIMING: waxing and waning SEVERITY: severe QUALITY: cramping. sharp CONTEXT: patient states that multiple members of her family have had flu lately. Her pain comes and goes without any obvious pattern. States she has a history of acid reflux but is currently not on any medication. MODIFYING FACTORS: not worsened by eating. Worse with movement. ASSOCIATED SYMPTOMS: black stools. No diarrhea. No vomiting. No fevers.  Per medical record review patient has a history of acid reflux.  Past Medical History:  Diagnosis Date  . Acid reflux   . Constipation   . Depression   . Eye problems   . No pertinent past medical history     Patient Active Problem List   Diagnosis Date Noted  . ADHD (attention deficit hyperactivity disorder) 02/04/2017  . Auditory processing disorder 02/04/2017  . Pseudotumor cerebri syndrome 08/01/2014  . Encounter for IUD insertion 01/24/2013  . Depression 08/30/2012  . Postpartum depression 08/26/2011    Past Surgical History:  Procedure Laterality Date  . NO PAST SURGERIES      Prior to Admission medications   Medication Sig Start Date End Date Taking? Authorizing Provider  acetaZOLAMIDE (DIAMOX) 500 MG capsule Take 500 mg by mouth 2 (two) times daily.    [provider]  buPROPion (WELLBUTRIN XL) 150 MG 24 hr tablet Take 150 mg by mouth daily.    [provider]  cetirizine (ZYRTEC) 10 MG tablet Take 10 mg by mouth daily.    [provider]  FLUoxetine (PROZAC) 20 MG capsule Take 1 capsule  (20 mg total) by mouth daily after breakfast. Patient not taking: Reported on 01/09/2017 09/05/12 05/09/15  Donnamae Jude, MD  levonorgestrel (MIRENA) 20 MCG/24HR IUD 1 Intra Uterine Device (1 each total) by Intrauterine route once. Will place in clinic 01/24/13   Donnamae Jude, MD  metroNIDAZOLE (FLAGYL) 500 MG tablet Take 1 tablet (500 mg total) by mouth 2 (two) times daily. 08/10/17   Anyanwu, Sallyanne Havers, MD  ondansetron (ZOFRAN ODT) 4 MG disintegrating tablet Take 1 tablet (4 mg total) by mouth every 8 (eight) hours as needed for nausea or vomiting. 01/09/17   Harvest Dark, MD  pantoprazole (PROTONIX) 40 MG tablet Take 40 mg by mouth daily.    [provider]    Allergies Tomato  Family History  Problem Relation Age of Onset  . Diabetes Maternal Grandmother   . Heart disease Maternal Grandmother   . Stroke Maternal Grandmother     Social History Social History   Tobacco Use  . Smoking status: Former Smoker    Packs/day: 1.00    Types: Cigarettes    Last attempt to quit: 12/24/2016    Years since quitting: 0.8  . Smokeless tobacco: Never Used  . Tobacco comment: vapes  Substance Use Topics  . Alcohol use: No  . Drug use: No    Review of Systems Constitutional: No fever/chills Eyes: No visual changes. ENT: No sore throat. Cardiovascular: Denies chest pain. Respiratory: Denies shortness of breath. Gastrointestinal: Positive for abdominal pain.    Genitourinary: Negative for dysuria. Musculoskeletal: Negative for back pain. Skin:  Negative for rash. Neurological: Negative for headaches, focal weakness or numbness.  ____________________________________________   PHYSICAL EXAM:  VITAL SIGNS: ED Triage Vitals  Enc Vitals Group     BP 11/03/17 1412 130/89     Pulse Rate 11/03/17 1412 97     Resp 11/03/17 1412 20     Temp 11/03/17 1412 97.8 F (36.6 C)     Temp Source 11/03/17 1412 Oral     SpO2 11/03/17 1412 96 %     Weight 11/03/17 1412 270 lb (122.5  kg)     Height 11/03/17 1412 5' 4"  (1.626 m)     Head Circumference --      Peak Flow --      Pain Score 11/03/17 1419 7   Constitutional: Alert and oriented. Well appearing and in no distress. Eyes: Conjunctivae are normal.  ENT   Head: Normocephalic and atraumatic.   Nose: No congestion/rhinnorhea.   Mouth/Throat: Mucous membranes are moist.   Neck: No stridor. Hematological/Lymphatic/Immunilogical: No cervical lymphadenopathy. Cardiovascular: Normal rate, regular rhythm.  No murmurs, rubs, or gallops.  Respiratory: Normal respiratory effort without tachypnea nor retractions. Breath sounds are clear and equal bilaterally. No wheezes/rales/rhonchi. Gastrointestinal: Soft and tender to palpation in the left upper quadrant. No rebound. No guarding.  Genitourinary: Deferred Musculoskeletal: Normal range of motion in all extremities. No lower extremity edema. Neurologic:  Normal speech and language. No gross focal neurologic deficits are appreciated.  Skin:  Skin is warm, dry and intact. No rash noted. Psychiatric: Mood and affect are normal. Speech and behavior are normal. Patient exhibits appropriate insight and judgment.  ____________________________________________    LABS (pertinent positives/negatives)  Upreg negative Lipase 23 CBC wnl CMP glu 117, alk phos 35 otherwise wnl UA not consistent with infection   ____________________________________________   EKG  None  ____________________________________________    RADIOLOGY  None  ____________________________________________   PROCEDURES  Procedures  ____________________________________________   INITIAL IMPRESSION / ASSESSMENT AND PLAN / ED COURSE  Pertinent labs & imaging results that were available during my care of the patient were reviewed by me and considered in my medical decision making (see chart for details).  Patient presented with upper abdominal pain. ddx would include  pancreatitis, hepatitis, gastritis, duodenitis, pyelonephritis amongst other etiologies. Work up without concerning findings. Patient did feel better after GI cocktail which is suggestive of gastritis. Will plan on prescribing antacid and sucralfate. Discussed findings and plan with patient.    ____________________________________________   FINAL CLINICAL IMPRESSION(S) / ED DIAGNOSES  Final diagnoses:  Abdominal pain, unspecified abdominal location  Acute gastritis, presence of bleeding unspecified, unspecified gastritis type     Note: This dictation was prepared with Dragon dictation. Any transcriptional errors that result from this process are unintentional     Nance Pear, MD 11/03/17 1642

## 2017-11-03 NOTE — Discharge Instructions (Signed)
Please seek medical attention for any high fevers, chest pain, shortness of breath, change in behavior, persistent vomiting, bloody stool or any other new or concerning symptoms.  

## 2017-11-03 NOTE — ED Notes (Signed)
Signature pad not working - pt verbalized understanding of discharge instructions and prescriptions

## 2017-11-03 NOTE — ED Triage Notes (Signed)
Pt reports generalized abdominal pain for three days. Denies vomiting or diarrhea, states some nausea. Denies fever. Ambulatory to triage. No apparent distress noted.

## 2018-01-15 ENCOUNTER — Encounter: Payer: Self-pay | Admitting: Emergency Medicine

## 2018-01-15 ENCOUNTER — Other Ambulatory Visit: Payer: Self-pay

## 2018-01-15 DIAGNOSIS — Z5321 Procedure and treatment not carried out due to patient leaving prior to being seen by health care provider: Secondary | ICD-10-CM | POA: Insufficient documentation

## 2018-01-15 DIAGNOSIS — R1084 Generalized abdominal pain: Secondary | ICD-10-CM | POA: Insufficient documentation

## 2018-01-15 DIAGNOSIS — R197 Diarrhea, unspecified: Secondary | ICD-10-CM | POA: Insufficient documentation

## 2018-01-15 DIAGNOSIS — R1110 Vomiting, unspecified: Secondary | ICD-10-CM | POA: Insufficient documentation

## 2018-01-15 LAB — COMPREHENSIVE METABOLIC PANEL
ALT: 22 U/L (ref 14–54)
AST: 16 U/L (ref 15–41)
Albumin: 4 g/dL (ref 3.5–5.0)
Alkaline Phosphatase: 33 U/L — ABNORMAL LOW (ref 38–126)
Anion gap: 6 (ref 5–15)
BUN: 14 mg/dL (ref 6–20)
CO2: 25 mmol/L (ref 22–32)
Calcium: 9.8 mg/dL (ref 8.9–10.3)
Chloride: 108 mmol/L (ref 101–111)
Creatinine, Ser: 0.89 mg/dL (ref 0.44–1.00)
GFR calc Af Amer: >60 mL/min (ref >60)
GFR calc non Af Amer: >60 mL/min (ref >60)
Glucose, Bld: 88 mg/dL (ref 65–99)
Potassium: 4.1 mmol/L (ref 3.5–5.1)
Sodium: 139 mmol/L (ref 135–145)
Total Bilirubin: 0.5 mg/dL (ref 0.3–1.2)
Total Protein: 7.3 g/dL (ref 6.5–8.1)

## 2018-01-15 LAB — URINALYSIS, COMPLETE (UACMP) WITH MICROSCOPIC
Bacteria, UA: NONE SEEN
Bilirubin Urine: NEGATIVE
Glucose, UA: NEGATIVE mg/dL
Hgb urine dipstick: NEGATIVE
Ketones, ur: NEGATIVE mg/dL
Leukocytes, UA: NEGATIVE
Nitrite: NEGATIVE
Protein, ur: NEGATIVE mg/dL
RBC / HPF: NONE SEEN RBC/hpf (ref 0–5)
Specific Gravity, Urine: 1.011 (ref 1.005–1.030)
pH: 5 (ref 5.0–8.0)

## 2018-01-15 LAB — CBC
HCT: 44.5 % (ref 35.0–47.0)
Hemoglobin: 14.8 g/dL (ref 12.0–16.0)
MCH: 29.4 pg (ref 26.0–34.0)
MCHC: 33.2 g/dL (ref 32.0–36.0)
MCV: 88.5 fL (ref 80.0–100.0)
Platelets: 450 10*3/uL — ABNORMAL HIGH (ref 150–440)
RBC: 5.03 MIL/uL (ref 3.80–5.20)
RDW: 13.5 % (ref 11.5–14.5)
WBC: 11.7 10*3/uL — ABNORMAL HIGH (ref 3.6–11.0)

## 2018-01-15 LAB — POCT PREGNANCY, URINE: Preg Test, Ur: NEGATIVE

## 2018-01-15 LAB — LIPASE, BLOOD: Lipase: 25 U/L (ref 11–51)

## 2018-01-15 MED ORDER — ONDANSETRON 4 MG PO TBDP
4.0000 mg | ORAL_TABLET | Freq: Once | ORAL | Status: AC
  Administered 2018-01-15: 4 mg via ORAL
  Filled 2018-01-15: qty 1

## 2018-01-15 NOTE — ED Notes (Signed)
Pt reports she has an stomach ulcer

## 2018-01-15 NOTE — ED Triage Notes (Signed)
Pt reports diarrhea for 2 days and vomiting for one day. Pt reports abdominal pain generalized abdominal pain

## 2018-01-16 ENCOUNTER — Emergency Department
Admission: EM | Admit: 2018-01-16 | Discharge: 2018-01-16 | Disposition: A | Discharge status: Home / Self Care | Payer: Medicaid Other | Attending: Emergency Medicine | Admitting: Emergency Medicine

## 2018-01-17 ENCOUNTER — Telehealth: Payer: Self-pay | Admitting: Emergency Medicine

## 2018-01-17 NOTE — Telephone Encounter (Signed)
Called patient due to lwot to inquire about condition and follow up plans. Did not leave message as voicemail recording indicated it was another person.

## 2018-04-07 ENCOUNTER — Other Ambulatory Visit: Payer: Self-pay

## 2018-04-07 ENCOUNTER — Encounter: Payer: Self-pay | Admitting: Emergency Medicine

## 2018-04-07 ENCOUNTER — Emergency Department
Admission: EM | Admit: 2018-04-07 | Discharge: 2018-04-07 | Disposition: A | Discharge status: Home / Self Care | Payer: Medicaid Other | Attending: Emergency Medicine | Admitting: Emergency Medicine

## 2018-04-07 DIAGNOSIS — Z79899 Other long term (current) drug therapy: Secondary | ICD-10-CM | POA: Insufficient documentation

## 2018-04-07 DIAGNOSIS — R112 Nausea with vomiting, unspecified: Secondary | ICD-10-CM | POA: Diagnosis not present

## 2018-04-07 DIAGNOSIS — R109 Unspecified abdominal pain: Secondary | ICD-10-CM | POA: Diagnosis present

## 2018-04-07 DIAGNOSIS — Z87891 Personal history of nicotine dependence: Secondary | ICD-10-CM | POA: Insufficient documentation

## 2018-04-07 DIAGNOSIS — A0839 Other viral enteritis: Secondary | ICD-10-CM | POA: Diagnosis not present

## 2018-04-07 DIAGNOSIS — R197 Diarrhea, unspecified: Secondary | ICD-10-CM | POA: Diagnosis not present

## 2018-04-07 DIAGNOSIS — A084 Viral intestinal infection, unspecified: Secondary | ICD-10-CM

## 2018-04-07 LAB — CBC
HCT: 45 % (ref 35.0–47.0)
Hemoglobin: 15.3 g/dL (ref 12.0–16.0)
MCH: 30.2 pg (ref 26.0–34.0)
MCHC: 33.9 g/dL (ref 32.0–36.0)
MCV: 89.1 fL (ref 80.0–100.0)
Platelets: 470 10*3/uL — ABNORMAL HIGH (ref 150–440)
RBC: 5.06 MIL/uL (ref 3.80–5.20)
RDW: 13 % (ref 11.5–14.5)
WBC: 16.1 10*3/uL — ABNORMAL HIGH (ref 3.6–11.0)

## 2018-04-07 LAB — URINALYSIS, COMPLETE (UACMP) WITH MICROSCOPIC
Bacteria, UA: NONE SEEN
Bilirubin Urine: NEGATIVE
Glucose, UA: NEGATIVE mg/dL
Hgb urine dipstick: NEGATIVE
Ketones, ur: NEGATIVE mg/dL
Nitrite: NEGATIVE
Protein, ur: NEGATIVE mg/dL
Specific Gravity, Urine: 1.016 (ref 1.005–1.030)
pH: 5 (ref 5.0–8.0)

## 2018-04-07 LAB — COMPREHENSIVE METABOLIC PANEL
ALT: 29 U/L (ref 14–54)
AST: 24 U/L (ref 15–41)
Albumin: 4.2 g/dL (ref 3.5–5.0)
Alkaline Phosphatase: 33 U/L — ABNORMAL LOW (ref 38–126)
Anion gap: 13 (ref 5–15)
BUN: 14 mg/dL (ref 6–20)
CO2: 21 mmol/L — ABNORMAL LOW (ref 22–32)
Calcium: 9.6 mg/dL (ref 8.9–10.3)
Chloride: 103 mmol/L (ref 101–111)
Creatinine, Ser: 0.89 mg/dL (ref 0.44–1.00)
GFR calc Af Amer: >60 mL/min (ref >60)
GFR calc non Af Amer: >60 mL/min (ref >60)
Glucose, Bld: 89 mg/dL (ref 65–99)
Potassium: 3.8 mmol/L (ref 3.5–5.1)
Sodium: 137 mmol/L (ref 135–145)
Total Bilirubin: 0.7 mg/dL (ref 0.3–1.2)
Total Protein: 7.5 g/dL (ref 6.5–8.1)

## 2018-04-07 LAB — LIPASE, BLOOD: Lipase: 25 U/L (ref 11–51)

## 2018-04-07 LAB — HCG, QUANTITATIVE, PREGNANCY: hCG, Beta Chain, Quant, S: 1 m[IU]/mL

## 2018-04-07 MED ORDER — IBUPROFEN 600 MG PO TABS: 600.0000 mg | ORAL_TABLET | Freq: Four times a day (QID) | ORAL | 0 refills | Status: DC | PRN

## 2018-04-07 MED ORDER — IBUPROFEN 400 MG PO TABS
600.0000 mg | ORAL_TABLET | Freq: Once | ORAL | Status: AC
  Administered 2018-04-07: 600 mg via ORAL
  Filled 2018-04-07: qty 2

## 2018-04-07 MED ORDER — ONDANSETRON 4 MG PO TBDP
4.0000 mg | ORAL_TABLET | Freq: Once | ORAL | Status: AC
  Administered 2018-04-07: 4 mg via ORAL
  Filled 2018-04-07: qty 1

## 2018-04-07 MED ORDER — ONDANSETRON 4 MG PO TBDP: 4.0000 mg | ORAL_TABLET | Freq: Three times a day (TID) | ORAL | 0 refills | Status: DC | PRN

## 2018-04-07 NOTE — ED Notes (Signed)
Pt c/o upper and lower abd pain for the past week with N/V/D. Denies any vaginal discharge.

## 2018-04-07 NOTE — ED Triage Notes (Signed)
PT to ED via POV with c/o abd cramping and n/v/d x1wk. PT ambulatory, denies fever. VSS

## 2018-04-07 NOTE — ED Provider Notes (Signed)
[facility] Emergency Department Provider Note  ____________________________________________  Time seen: Approximately 6:03 PM  I have reviewed the triage vital signs and the nursing notes.   HISTORY  Chief Complaint Abdominal Pain   HPI Arleth E Mcgann is a 27 y.o. female with history of pseudotumor cerebri, depression, constipation, acid reflux who presents for evaluation of abdominal pain.  Patient reports 1 week of intermittent diffuse abdominal cramping, nausea, vomiting diarrhea.  She reports 3-4 episodes daily of nonbloody nonbilious emesis and several episodes of watery diarrhea.  No other household members with similar symptoms.  She has tried ibuprofen at home with some relief.  At this time she reports that her pain is very minimal.  No prior abdominal surgeries.  No fever but has had chills.  No chest pain or shortness of breath, no URI symptoms.  She is also complaining of mild pressure in the suprapubic region with urination for the last few days.  No vaginal discharge.  Past Medical History:  Diagnosis Date  . Acid reflux   . Constipation   . Depression   . Eye problems   . No pertinent past medical history     Patient Active Problem List   Diagnosis Date Noted  . ADHD (attention deficit hyperactivity disorder) 02/04/2017  . Auditory processing disorder 02/04/2017  . Pseudotumor cerebri syndrome 08/01/2014  . Encounter for IUD insertion 01/24/2013  . Depression 08/30/2012  . Postpartum depression 08/26/2011    Past Surgical History:  Procedure Laterality Date  . NO PAST SURGERIES      Prior to Admission medications   Medication Sig Start Date End Date Taking? Authorizing Provider  acetaZOLAMIDE (DIAMOX) 500 MG capsule Take 500 mg by mouth 2 (two) times daily.    [provider]  buPROPion (WELLBUTRIN XL) 150 MG 24 hr tablet Take 150 mg by mouth daily.    [provider]  cetirizine (ZYRTEC) 10 MG tablet Take 10  mg by mouth daily.    [provider]  famotidine (PEPCID) 40 MG tablet Take 1 tablet (40 mg total) by mouth every evening. 11/03/17 11/03/18  Phineas Semen, MD  FLUoxetine (PROZAC) 20 MG capsule Take 1 capsule (20 mg total) by mouth daily after breakfast. Patient not taking: Reported on 01/09/2017 09/05/12 05/09/15  Reva Bores, MD  ibuprofen (ADVIL,MOTRIN) 600 MG tablet Take 1 tablet (600 mg total) by mouth every 6 (six) hours as needed. 04/07/18   Nita Sickle, MD  levonorgestrel (MIRENA) 20 MCG/24HR IUD 1 Intra Uterine Device (1 each total) by Intrauterine route once. Will place in clinic 01/24/13   Reva Bores, MD  metroNIDAZOLE (FLAGYL) 500 MG tablet Take 1 tablet (500 mg total) by mouth 2 (two) times daily. 08/10/17   Anyanwu, Jethro Bastos, MD  ondansetron (ZOFRAN ODT) 4 MG disintegrating tablet Take 1 tablet (4 mg total) by mouth every 8 (eight) hours as needed for nausea or vomiting. 04/07/18   Don Perking, [place], MD  pantoprazole (PROTONIX) 40 MG tablet Take 40 mg by mouth daily.    [provider]  sucralfate (CARAFATE) 1 g tablet Take 1 tablet (1 g total) by mouth 4 (four) times daily. 11/03/17   Phineas Semen, MD    Allergies Tomato  Family History  Problem Relation Age of Onset  . Diabetes Maternal Grandmother   . Heart disease Maternal Grandmother   . Stroke Maternal Grandmother     Social History Social History   Tobacco Use  . Smoking status: Former  Smoker    Packs/day: 1.00    Types: Cigarettes    Last attempt to quit: 12/24/2016    Years since quitting: 1.2  . Smokeless tobacco: Never Used  . Tobacco comment: vapes  Substance Use Topics  . Alcohol use: No  . Drug use: No    Review of Systems  Constitutional: Negative for fever. + chills Eyes: Negative for visual changes. ENT: Negative for sore throat. Neck: No neck pain  Cardiovascular: Negative for chest pain. Respiratory: Negative for shortness of breath. Gastrointestinal: +  abdominal pain, vomiting and diarrhea. Genitourinary: Negative for dysuria. Musculoskeletal: Negative for back pain. Skin: Negative for rash. Neurological: Negative for headaches, weakness or numbness. Psych: No SI or HI  ____________________________________________   PHYSICAL EXAM:  VITAL SIGNS: Vitals:   04/07/18 1742 04/07/18 1807  BP:  106/72  Pulse:  96  Resp:  16  Temp: 98.1 F (36.7 C)   SpO2:  96%    Constitutional: Alert and oriented. Well appearing and in no apparent distress. HEENT:      Head: Normocephalic and atraumatic.         Eyes: Conjunctivae are normal. Sclera is non-icteric.       Mouth/Throat: Mucous membranes are moist.       Neck: Supple with no signs of meningismus. Cardiovascular: Regular rate and rhythm. No murmurs, gallops, or rubs. 2+ symmetrical distal pulses are present in all extremities. No JVD. Respiratory: Normal respiratory effort. Lungs are clear to auscultation bilaterally. No wheezes, crackles, or rhonchi.  Gastrointestinal: Obese, soft, mild diffuse ttp, and non distended with positive bowel sounds. No rebound or guarding. Genitourinary: No CVA tenderness. Musculoskeletal: Nontender with normal range of motion in all extremities. No edema, cyanosis, or erythema of extremities. Neurologic: Normal speech and language. Face is symmetric. Moving all extremities. No gross focal neurologic deficits are appreciated. Skin: Skin is warm, dry and intact. No rash noted. Psychiatric: Mood and affect are normal. Speech and behavior are normal.  ____________________________________________   LABS (all labs ordered are listed, but only abnormal results are displayed)  Labs Reviewed  COMPREHENSIVE METABOLIC PANEL - Abnormal; Notable for the following components:      Result Value   CO2 21 (*)    Alkaline Phosphatase 33 (*)    All other components within normal limits  CBC - Abnormal; Notable for the following components:   WBC 16.1 (*)     Platelets 470 (*)    All other components within normal limits  URINALYSIS, COMPLETE (UACMP) WITH MICROSCOPIC - Abnormal; Notable for the following components:   Color, Urine YELLOW (*)    APPearance HAZY (*)    Leukocytes, UA SMALL (*)    All other components within normal limits  URINE CULTURE  LIPASE, BLOOD  HCG, QUANTITATIVE, PREGNANCY   ____________________________________________  EKG  none  ____________________________________________  RADIOLOGY  none  ____________________________________________   PROCEDURES  Procedure(s) performed: None Procedures Critical Care performed:  None ____________________________________________   INITIAL IMPRESSION / ASSESSMENT AND PLAN / ED COURSE   27 y.o. female with history of pseudotumor cerebri, depression, constipation, acid reflux who presents for evaluation of abdominal pain, N/V/D x 1 week.  Patient is well-appearing, no distress, normal vital signs, abdomen is soft with diffuse mild tenderness, no rebound or guarding.  Presentation concerning for viral gastroenteritis versus colitis versus enteritis versus gastritis.  Plan for CBC, CMP, lipase, pregnancy test, urinalysis.  Will give ibuprofen and Zofran for symptoms.  She has no right lower quadrant or  right upper quadrant tenderness.  At this time no clinical signs or symptoms of gallbladder or appendicitis.    _________________________ 7:14 PM on 04/07/2018 -----------------------------------------  Patient remains extremely well-appearing with no focal tenderness on exam.  Labs showing leukocytosis but no other acute findings which is consistent with a viral gastroenteritis.  She is tolerating p.o.  She is going to be discharged home on ibuprofen and Zofran.  Discussed return precautions for new or worsening abdominal pain and follow-up with primary care doctor peer   As part of my medical decision making, I reviewed the following data within the electronic MEDICAL RECORD NUMBER  Nursing notes reviewed and incorporated, Labs reviewed , Old chart reviewed, Notes from prior ED visits and [place] Controlled Substance Database    Pertinent labs & imaging results that were available during my care of the patient were reviewed by me and considered in my medical decision making (see chart for details).    ____________________________________________   FINAL CLINICAL IMPRESSION(S) / ED DIAGNOSES  Final diagnoses:  Viral gastroenteritis      NEW MEDICATIONS STARTED DURING THIS VISIT:  ED Discharge Orders        Ordered    ondansetron (ZOFRAN ODT) 4 MG disintegrating tablet  Every 8 hours PRN     04/07/18 1911    ibuprofen (ADVIL,MOTRIN) 600 MG tablet  Every 6 hours PRN     04/07/18 1911       Note:  This document was prepared using Dragon voice recognition software and may include unintentional dictation errors.    Don PerkingVeronese, [place], MD 04/07/18 [id number]

## 2018-04-09 LAB — URINE CULTURE: Culture: >=100000 — AB

## 2018-12-31 ENCOUNTER — Encounter: Payer: Self-pay | Admitting: Emergency Medicine

## 2018-12-31 ENCOUNTER — Emergency Department: Payer: Medicaid Other

## 2018-12-31 ENCOUNTER — Other Ambulatory Visit: Payer: Self-pay

## 2018-12-31 ENCOUNTER — Emergency Department
Admission: EM | Admit: 2018-12-31 | Discharge: 2018-12-31 | Disposition: A | Discharge status: Home / Self Care | Payer: Medicaid Other | Attending: Emergency Medicine | Admitting: Emergency Medicine

## 2018-12-31 DIAGNOSIS — Y92410 Unspecified street and highway as the place of occurrence of the external cause: Secondary | ICD-10-CM | POA: Diagnosis not present

## 2018-12-31 DIAGNOSIS — M549 Dorsalgia, unspecified: Secondary | ICD-10-CM | POA: Insufficient documentation

## 2018-12-31 DIAGNOSIS — Y998 Other external cause status: Secondary | ICD-10-CM | POA: Diagnosis not present

## 2018-12-31 DIAGNOSIS — Z87891 Personal history of nicotine dependence: Secondary | ICD-10-CM | POA: Diagnosis not present

## 2018-12-31 DIAGNOSIS — Y9389 Activity, other specified: Secondary | ICD-10-CM | POA: Diagnosis not present

## 2018-12-31 DIAGNOSIS — S161XXA Strain of muscle, fascia and tendon at neck level, initial encounter: Secondary | ICD-10-CM | POA: Diagnosis not present

## 2018-12-31 DIAGNOSIS — M7918 Myalgia, other site: Secondary | ICD-10-CM | POA: Insufficient documentation

## 2018-12-31 DIAGNOSIS — S199XXA Unspecified injury of neck, initial encounter: Secondary | ICD-10-CM | POA: Diagnosis present

## 2018-12-31 DIAGNOSIS — Z79899 Other long term (current) drug therapy: Secondary | ICD-10-CM | POA: Insufficient documentation

## 2018-12-31 LAB — POCT PREGNANCY, URINE: Preg Test, Ur: NEGATIVE

## 2018-12-31 MED ORDER — CYCLOBENZAPRINE HCL 10 MG PO TABS: 10.0000 mg | ORAL_TABLET | Freq: Three times a day (TID) | ORAL | 0 refills | Status: DC | PRN

## 2018-12-31 MED ORDER — OXYCODONE-ACETAMINOPHEN 5-325 MG PO TABS
1.0000 | ORAL_TABLET | Freq: Once | ORAL | Status: AC
  Administered 2018-12-31: 1 via ORAL
  Filled 2018-12-31: qty 1

## 2018-12-31 MED ORDER — IBUPROFEN 600 MG PO TABS: 600.0000 mg | ORAL_TABLET | Freq: Three times a day (TID) | ORAL | 0 refills | Status: DC | PRN

## 2018-12-31 MED ORDER — OXYCODONE-ACETAMINOPHEN 7.5-325 MG PO TABS: 1.0000 | ORAL_TABLET | Freq: Four times a day (QID) | ORAL | 0 refills | Status: DC | PRN

## 2018-12-31 MED ORDER — CYCLOBENZAPRINE HCL 10 MG PO TABS
10.0000 mg | ORAL_TABLET | Freq: Once | ORAL | Status: AC
  Administered 2018-12-31: 10 mg via ORAL
  Filled 2018-12-31: qty 1

## 2018-12-31 MED ORDER — IBUPROFEN 600 MG PO TABS
600.0000 mg | ORAL_TABLET | Freq: Once | ORAL | Status: AC
  Administered 2018-12-31: 600 mg via ORAL
  Filled 2018-12-31: qty 1

## 2018-12-31 NOTE — ED Notes (Signed)
Pre Neg
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
## 2018-12-31 NOTE — ED Notes (Signed)
Pt requesting blanket. Pt given blanket.
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
## 2019-08-09 ENCOUNTER — Other Ambulatory Visit: Payer: Self-pay

## 2019-08-09 ENCOUNTER — Emergency Department
Admission: EM | Admit: 2019-08-09 | Discharge: 2019-08-09 | Disposition: A | Discharge status: Home / Self Care | Payer: Medicaid Other | Attending: Student in an Organized Health Care Education/Training Program | Admitting: Student in an Organized Health Care Education/Training Program

## 2019-08-09 ENCOUNTER — Emergency Department: Payer: Medicaid Other

## 2019-08-09 ENCOUNTER — Encounter: Payer: Self-pay | Admitting: Emergency Medicine

## 2019-08-09 DIAGNOSIS — Z79899 Other long term (current) drug therapy: Secondary | ICD-10-CM | POA: Diagnosis not present

## 2019-08-09 DIAGNOSIS — Z20828 Contact with and (suspected) exposure to other viral communicable diseases: Secondary | ICD-10-CM | POA: Insufficient documentation

## 2019-08-09 DIAGNOSIS — R1012 Left upper quadrant pain: Secondary | ICD-10-CM | POA: Insufficient documentation

## 2019-08-09 DIAGNOSIS — Z87891 Personal history of nicotine dependence: Secondary | ICD-10-CM | POA: Diagnosis not present

## 2019-08-09 DIAGNOSIS — R110 Nausea: Secondary | ICD-10-CM | POA: Diagnosis not present

## 2019-08-09 DIAGNOSIS — R1013 Epigastric pain: Secondary | ICD-10-CM | POA: Diagnosis present

## 2019-08-09 LAB — COMPREHENSIVE METABOLIC PANEL
ALT: 20 U/L (ref 0–44)
AST: 15 U/L (ref 15–41)
Albumin: 4.5 g/dL (ref 3.5–5.0)
Alkaline Phosphatase: 27 U/L — ABNORMAL LOW (ref 38–126)
Anion gap: 11 (ref 5–15)
BUN: 12 mg/dL (ref 6–20)
CO2: 24 mmol/L (ref 22–32)
Calcium: 9.6 mg/dL (ref 8.9–10.3)
Chloride: 107 mmol/L (ref 98–111)
Creatinine, Ser: 0.9 mg/dL (ref 0.44–1.00)
GFR calc Af Amer: >60 mL/min (ref >60)
GFR calc non Af Amer: >60 mL/min (ref >60)
Glucose, Bld: 86 mg/dL (ref 70–99)
Potassium: 3.9 mmol/L (ref 3.5–5.1)
Sodium: 142 mmol/L (ref 135–145)
Total Bilirubin: 1.6 mg/dL — ABNORMAL HIGH (ref 0.3–1.2)
Total Protein: 7.6 g/dL (ref 6.5–8.1)

## 2019-08-09 LAB — CBC
HCT: 44.5 % (ref 36.0–46.0)
Hemoglobin: 15 g/dL (ref 12.0–15.0)
MCH: 30.5 pg (ref 26.0–34.0)
MCHC: 33.7 g/dL (ref 30.0–36.0)
MCV: 90.6 fL (ref 80.0–100.0)
Platelets: 438 10*3/uL — ABNORMAL HIGH (ref 150–400)
RBC: 4.91 MIL/uL (ref 3.87–5.11)
RDW: 11.8 % (ref 11.5–15.5)
WBC: 10.9 10*3/uL — ABNORMAL HIGH (ref 4.0–10.5)
nRBC: 0 % (ref 0.0–0.2)

## 2019-08-09 LAB — URINALYSIS, COMPLETE (UACMP) WITH MICROSCOPIC
Bacteria, UA: NONE SEEN
Bilirubin Urine: NEGATIVE
Glucose, UA: NEGATIVE mg/dL
Hgb urine dipstick: NEGATIVE
Ketones, ur: 5 mg/dL — AB
Nitrite: NEGATIVE
Protein, ur: 30 mg/dL — AB
Specific Gravity, Urine: 1.027 (ref 1.005–1.030)
pH: 5 (ref 5.0–8.0)

## 2019-08-09 LAB — POCT PREGNANCY, URINE: Preg Test, Ur: NEGATIVE

## 2019-08-09 LAB — LIPASE, BLOOD: Lipase: 17 U/L (ref 11–51)

## 2019-08-09 MED ORDER — IOHEXOL 300 MG/ML  SOLN
100.0000 mL | Freq: Once | INTRAMUSCULAR | Status: AC | PRN
  Administered 2019-08-09: 100 mL via INTRAVENOUS

## 2019-08-09 MED ORDER — SODIUM CHLORIDE 0.9 % IV BOLUS
1000.0000 mL | Freq: Once | INTRAVENOUS | Status: AC
  Administered 2019-08-09: 1000 mL via INTRAVENOUS

## 2019-08-09 MED ORDER — PROMETHAZINE HCL 12.5 MG PO TABS: 12.5000 mg | ORAL_TABLET | Freq: Four times a day (QID) | ORAL | 0 refills | Status: AC | PRN

## 2019-08-09 MED ORDER — PROMETHAZINE HCL 25 MG/ML IJ SOLN
12.5000 mg | Freq: Four times a day (QID) | INTRAMUSCULAR | Status: DC | PRN
  Administered 2019-08-09: 12.5 mg via INTRAVENOUS
  Filled 2019-08-09: qty 1

## 2019-08-09 MED ORDER — MORPHINE SULFATE (PF) 4 MG/ML IV SOLN
4.0000 mg | INTRAVENOUS | Status: DC | PRN
  Administered 2019-08-09: 4 mg via INTRAVENOUS
  Filled 2019-08-09: qty 1

## 2019-08-09 NOTE — ED Provider Notes (Signed)
[facility] Emergency Department Provider Note    First MD Initiated Contact with Patient 08/09/19 1756     (approximate)  I have reviewed the triage vital signs and the nursing notes.   HISTORY  Chief Complaint Abdominal Pain    HPI Leslie Duran is a 28 y.o. female below listed past medical history presents to the ER for evaluation of epigastric and left-sided abdominal pain associate with nausea vomiting difficulty keeping food down.  Patient states she has not passed gas in 24 hours has not had normal bowel movements in 48.  States the pain is mild to moderate in severity.  Denies any dysuria.  Is also having left flank pain.  Not currently on any antibiotics.    Past Medical History:  Diagnosis Date  . Acid reflux   . Constipation   . Depression   . Eye problems   . No pertinent past medical history    Family History  Problem Relation Age of Onset  . Diabetes Maternal Grandmother   . Heart disease Maternal Grandmother   . Stroke Maternal Grandmother    Past Surgical History:  Procedure Laterality Date  . NO PAST SURGERIES     Patient Active Problem List   Diagnosis Date Noted  . ADHD (attention deficit hyperactivity disorder) 02/04/2017  . Auditory processing disorder 02/04/2017  . Pseudotumor cerebri syndrome 08/01/2014  . Encounter for IUD insertion 01/24/2013  . Depression 08/30/2012  . Postpartum depression 08/26/2011      Prior to Admission medications   Medication Sig Start Date End Date Taking? Authorizing Provider  acetaZOLAMIDE (DIAMOX) 500 MG capsule Take 500 mg by mouth 2 (two) times daily.    [provider]  buPROPion (WELLBUTRIN XL) 150 MG 24 hr tablet Take 150 mg by mouth daily.    [provider]  cetirizine (ZYRTEC) 10 MG tablet Take 10 mg by mouth daily.    [provider]  FLUoxetine (PROZAC) 20 MG capsule Take 1 capsule (20 mg total) by mouth daily after breakfast. Patient not  taking: Reported on 01/09/2017 09/05/12 05/09/15  Reva BoresPratt, Tanya S, MD  levonorgestrel (MIRENA) 20 MCG/24HR IUD 1 Intra Uterine Device (1 each total) by Intrauterine route once. Will place in clinic 01/24/13   Reva BoresPratt, Tanya S, MD  pantoprazole (PROTONIX) 40 MG tablet Take 40 mg by mouth daily.    [provider]  promethazine (PHENERGAN) 12.5 MG tablet Take 1 tablet (12.5 mg total) by mouth every 6 (six) hours as needed. 08/09/19   Willy Eddyobinson, Jahlen Bollman, MD  sertraline (ZOLOFT) 100 MG tablet Take 100 mg by mouth daily. 05/22/19   [provider]  sucralfate (CARAFATE) 1 g tablet Take 1 tablet (1 g total) by mouth 4 (four) times daily. 11/03/17   Phineas SemenGoodman, [place], MD    Allergies Tomato    Social History Social History   Tobacco Use  . Smoking status: Former Smoker    Packs/day: 1.00    Types: Cigarettes    Quit date: 12/24/2016    Years since quitting: 2.6  . Smokeless tobacco: Never Used  . Tobacco comment: vapes  Substance Use Topics  . Alcohol use: No  . Drug use: No    Review of Systems Patient denies headaches, rhinorrhea, blurry vision, numbness, shortness of breath, chest pain, edema, cough, abdominal pain, nausea, vomiting, diarrhea, dysuria, fevers, rashes or hallucinations unless otherwise stated above in HPI. ____________________________________________   PHYSICAL EXAM:  VITAL SIGNS: Vitals:   08/09/19 1717  BP: 120/80  Pulse: 81  Resp: 18  Temp: 98.6 F (37 C)  SpO2: 96%    Constitutional: Alert and oriented.  Eyes: Conjunctivae are normal.  Head: Atraumatic. Nose: No congestion/rhinnorhea. Mouth/Throat: Mucous membranes are moist.   Neck: No stridor. Painless ROM.  Cardiovascular: Normal rate, regular rhythm. Grossly normal heart sounds.  Good peripheral circulation. Respiratory: Normal respiratory effort.  No retractions. Lungs CTAB. Gastrointestinal: Soft,obese with luq ttp. No distention. No abdominal bruits. + L CVA tenderness. Genitourinary:  deferred Musculoskeletal: No lower extremity tenderness nor edema.  No joint effusions. Neurologic:  Normal speech and language. No gross focal neurologic deficits are appreciated. No facial droop Skin:  Skin is warm, dry and intact. No rash noted. Psychiatric: Mood and affect are normal. Speech and behavior are normal.  ____________________________________________   LABS (all labs ordered are listed, but only abnormal results are displayed)  Results for orders placed or performed during the hospital encounter of 08/09/19 (from the past 24 hour(s))  Lipase, blood     Status: None   Collection Time: 08/09/19  5:22 PM  Result Value Ref Range   Lipase 17 11 - 51 U/L  Comprehensive metabolic panel     Status: Abnormal   Collection Time: 08/09/19  5:22 PM  Result Value Ref Range   Sodium 142 135 - 145 mmol/L   Potassium 3.9 3.5 - 5.1 mmol/L   Chloride 107 98 - 111 mmol/L   CO2 24 22 - 32 mmol/L   Glucose, Bld 86 70 - 99 mg/dL   BUN 12 6 - 20 mg/dL   Creatinine, Ser 1.61 0.44 - 1.00 mg/dL   Calcium 9.6 8.9 - 09.6 mg/dL   Total Protein 7.6 6.5 - 8.1 g/dL   Albumin 4.5 3.5 - 5.0 g/dL   AST 15 15 - 41 U/L   ALT 20 0 - 44 U/L   Alkaline Phosphatase 27 (L) 38 - 126 U/L   Total Bilirubin 1.6 (H) 0.3 - 1.2 mg/dL   GFR calc non Af Amer >60 >60 mL/min   GFR calc Af Amer >60 >60 mL/min   Anion gap 11 5 - 15  CBC     Status: Abnormal   Collection Time: 08/09/19  5:22 PM  Result Value Ref Range   WBC 10.9 (H) 4.0 - 10.5 K/uL   RBC 4.91 3.87 - 5.11 MIL/uL   Hemoglobin 15.0 12.0 - 15.0 g/dL   HCT 04.5 40.9 - 81.1 %   MCV 90.6 80.0 - 100.0 fL   MCH 30.5 26.0 - 34.0 pg   MCHC 33.7 30.0 - 36.0 g/dL   RDW 91.4 78.2 - 95.6 %   Platelets 438 (H) 150 - 400 K/uL   nRBC 0.0 0.0 - 0.2 %  Urinalysis, Complete w Microscopic     Status: Abnormal   Collection Time: 08/09/19  5:22 PM  Result Value Ref Range   Color, Urine AMBER (A) YELLOW   APPearance CLOUDY (A) CLEAR   Specific Gravity, Urine  1.027 1.005 - 1.030   pH 5.0 5.0 - 8.0   Glucose, UA NEGATIVE NEGATIVE mg/dL   Hgb urine dipstick NEGATIVE NEGATIVE   Bilirubin Urine NEGATIVE NEGATIVE   Ketones, ur 5 (A) NEGATIVE mg/dL   Protein, ur 30 (A) NEGATIVE mg/dL   Nitrite NEGATIVE NEGATIVE   Leukocytes,Ua TRACE (A) NEGATIVE   RBC / HPF 0-5 0 - 5 RBC/hpf   WBC, UA 6-10 0 - 5 WBC/hpf   Bacteria, UA NONE SEEN NONE SEEN  Squamous Epithelial / LPF 6-10 0 - 5   Mucus PRESENT   Pregnancy, urine POC     Status: None   Collection Time: 08/09/19  5:28 PM  Result Value Ref Range   Preg Test, Ur NEGATIVE NEGATIVE   ____________________________________________   ____________________________________________  RADIOLOGY  I personally reviewed all radiographic images ordered to evaluate for the above acute complaints and reviewed radiology reports and findings.  These findings were personally discussed with the patient.  Please see medical record for radiology report.  ____________________________________________   PROCEDURES  Procedure(s) performed:  Procedures    Critical Care performed: no ____________________________________________   INITIAL IMPRESSION / ASSESSMENT AND PLAN / ED COURSE  Pertinent labs & imaging results that were available during my care of the patient were reviewed by me and considered in my medical decision making (see chart for details).   DDX: Enteritis, diverticulitis, SBO, colitis, viral illness, pyelonephritis, hernia  Leslie Duran is a 28 y.o. who presents to the ED with nausea and abdominal pain as described above.  Does have some mild left-sided abdominal pain with somewhat limiting exam to the patient's obesity therefore CT imaging was ordered to further evaluate for the by differential.  CT imaging is reassuring.  Patient states that she has been stressed out lately.  Did feel significant improvement after antiemetics and pain medication.  She is tolerating oral hydration believe she  stable and appropriate for outpatient follow-up.     The patient was evaluated in Emergency Department today for the symptoms described in the history of present illness. He/she was evaluated in the context of the global COVID-19 pandemic, which necessitated consideration that the patient might be at risk for infection with the SARS-CoV-2 virus that causes COVID-19. Institutional protocols and algorithms that pertain to the evaluation of patients at risk for COVID-19 are in a state of rapid change based on information released by regulatory bodies including the CDC and federal and state organizations. These policies and algorithms were followed during the patient's care in the ED.  As part of my medical decision making, I reviewed the following data within the [place] notes reviewed and incorporated, Labs reviewed, notes from prior ED visits and [place] Controlled Substance Database   ____________________________________________   FINAL CLINICAL IMPRESSION(S) / ED DIAGNOSES  Final diagnoses:  Nausea  Left upper quadrant abdominal pain     NEW MEDICATIONS STARTED DURING THIS VISIT:  New Prescriptions   PROMETHAZINE (PHENERGAN) 12.5 MG TABLET    Take 1 tablet (12.5 mg total) by mouth every 6 (six) hours as needed.     Note:  This document was prepared using Dragon voice recognition software and may include unintentional dictation errors.    Merlyn Lot, MD 08/09/19 Kathyrn Drown

## 2019-08-09 NOTE — Discharge Instructions (Signed)

## 2019-08-09 NOTE — ED Triage Notes (Signed)
Pt presents to ED via wheelchair from [facility] with c/o LLQ/L flank pain. Pt states when she smells food she gets nauseated, unable to keep down food/drink. Pt states no chance she is pregnant. Pt is A&O x 4, NAD noted at this time.

## 2019-08-11 LAB — NOVEL CORONAVIRUS, NAA (HOSP ORDER, SEND-OUT TO REF LAB; TAT 18-24 HRS): SARS-CoV-2, NAA: NOT DETECTED

## 2020-05-25 ENCOUNTER — Other Ambulatory Visit: Payer: Self-pay

## 2020-05-25 DIAGNOSIS — Y939 Activity, unspecified: Secondary | ICD-10-CM | POA: Diagnosis not present

## 2020-05-25 DIAGNOSIS — Z79899 Other long term (current) drug therapy: Secondary | ICD-10-CM | POA: Insufficient documentation

## 2020-05-25 DIAGNOSIS — X12XXXA Contact with other hot fluids, initial encounter: Secondary | ICD-10-CM | POA: Insufficient documentation

## 2020-05-25 DIAGNOSIS — Y929 Unspecified place or not applicable: Secondary | ICD-10-CM | POA: Diagnosis not present

## 2020-05-25 DIAGNOSIS — Y999 Unspecified external cause status: Secondary | ICD-10-CM | POA: Diagnosis not present

## 2020-05-25 DIAGNOSIS — F909 Attention-deficit hyperactivity disorder, unspecified type: Secondary | ICD-10-CM | POA: Diagnosis not present

## 2020-05-25 DIAGNOSIS — T25022A Burn of unspecified degree of left foot, initial encounter: Secondary | ICD-10-CM | POA: Diagnosis not present

## 2020-05-25 DIAGNOSIS — Z87891 Personal history of nicotine dependence: Secondary | ICD-10-CM | POA: Diagnosis not present
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
## 2020-05-26 ENCOUNTER — Emergency Department
Admission: EM | Admit: 2020-05-26 | Discharge: 2020-05-26 | Disposition: A | Discharge status: Home / Self Care | Payer: No Typology Code available for payment source | Attending: Emergency Medicine | Admitting: Emergency Medicine

## 2020-05-26 DIAGNOSIS — T25122A Burn of first degree of left foot, initial encounter: Secondary | ICD-10-CM

## 2020-05-26 MED ORDER — SILVER SULFADIAZINE 1 % EX CREA: TOPICAL_CREAM | Freq: Once | CUTANEOUS | Status: AC

## 2020-05-26 MED ORDER — IBUPROFEN 600 MG PO TABS
600.0000 mg | ORAL_TABLET | Freq: Once | ORAL | Status: AC
  Administered 2020-05-26: 600 mg via ORAL
  Filled 2020-05-26: qty 1

## 2020-05-26 MED ORDER — SILVER SULFADIAZINE 1 % EX CREA: TOPICAL_CREAM | CUTANEOUS | 1 refills | Status: AC
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
## 2020-05-28 ENCOUNTER — Ambulatory Visit
Admission: RE | Admit: 2020-05-28 | Discharge: 2020-05-28 | Disposition: A | Discharge status: Home / Self Care | Payer: Medicaid Other | Attending: Family Medicine | Admitting: Family Medicine

## 2020-05-28 ENCOUNTER — Other Ambulatory Visit: Payer: Self-pay

## 2020-05-28 ENCOUNTER — Other Ambulatory Visit: Payer: Self-pay | Admitting: Family Medicine

## 2020-05-28 ENCOUNTER — Ambulatory Visit
Admission: RE | Admit: 2020-05-28 | Discharge: 2020-05-28 | Disposition: A | Discharge status: Home / Self Care | Payer: Medicaid Other | Source: Ambulatory Visit | Attending: Family Medicine | Admitting: Family Medicine

## 2020-05-28 DIAGNOSIS — M79672 Pain in left foot: Secondary | ICD-10-CM | POA: Diagnosis not present

## 2021-02-15 IMAGING — CT CT ABD-PELV W/ CM
2 of 4 series · 17 of 46 positions shown, 19 images · IV contrast (APPLIED)
Comparison: 02/19/2017 CT abdomen/pelvis.

CLINICAL DATA: Acute generalized abdominal pain, most prominent in
the left flank and left lower quadrant. Nausea.

EXAM:
CT ABDOMEN AND PELVIS WITH CONTRAST
TECHNIQUE: Multidetector CT imaging of the abdomen and pelvis was performed
using the standard protocol following bolus administration of
intravenous contrast.
CONTRAST:  100mL OMNIPAQUE IOHEXOL 300 MG/ML  SOLN

[Series 2: axial st · axial · 0.96mm/px · z∈[-497,-62]mm · 14 of 95 slices shown, 16 images]
[im 4/95  soft-tissue]
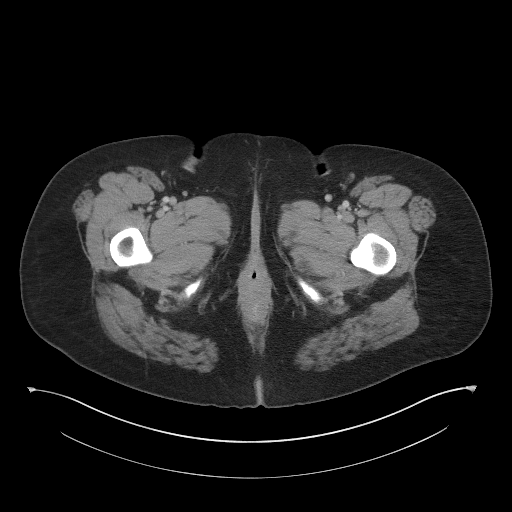
[im 4/95  bone]
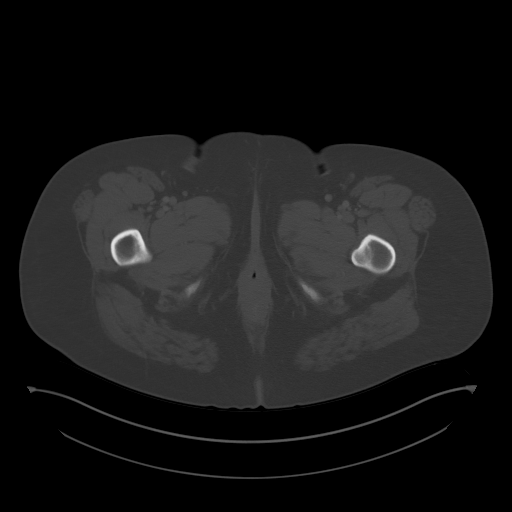
[im 12/95  soft-tissue]
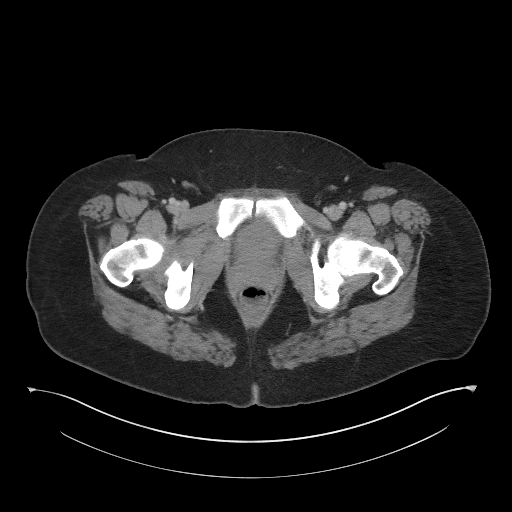
[im 19/95  soft-tissue]
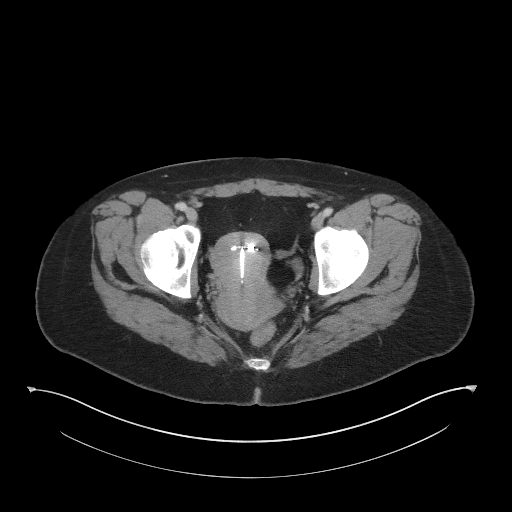
[im 27/95  soft-tissue]
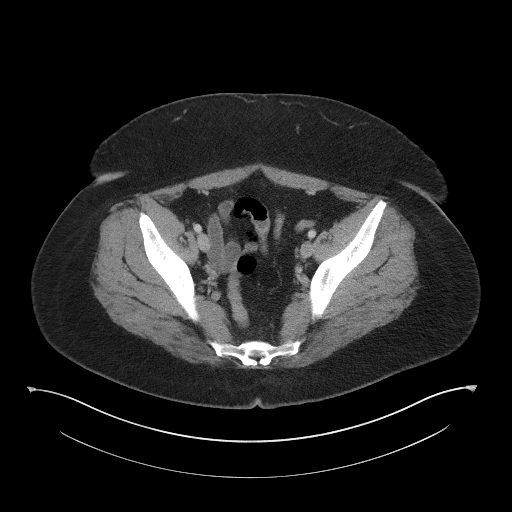
[im 31/95  soft-tissue]
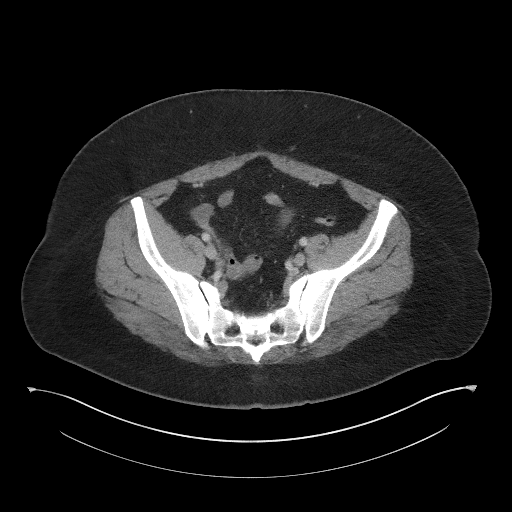
[im 38/95  soft-tissue]
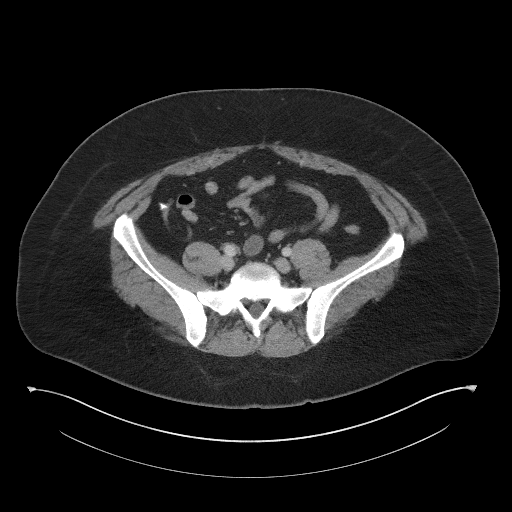
[im 46/95  soft-tissue]
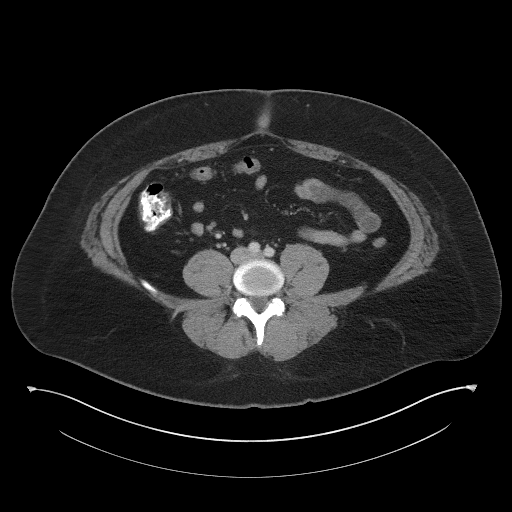
[im 49/95  soft-tissue]
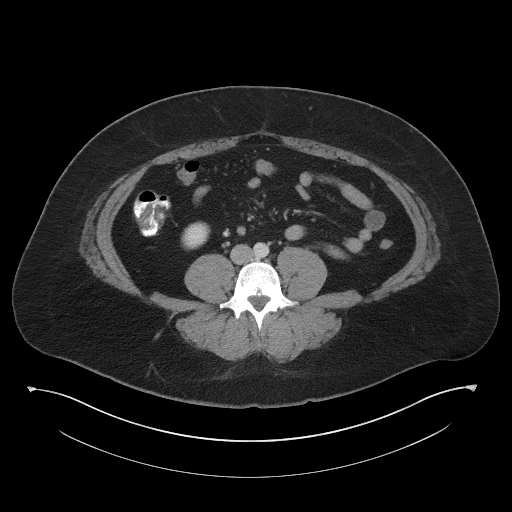
[im 57/95  soft-tissue]
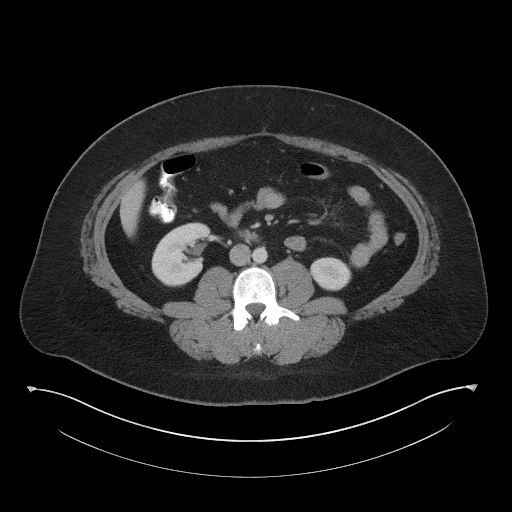
[im 57/95  bone]
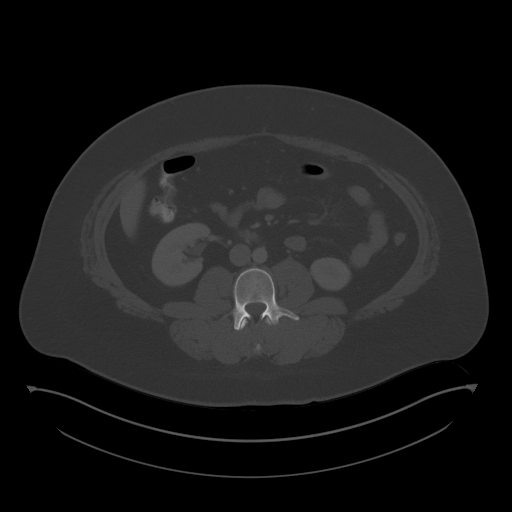
[im 64/95  soft-tissue]
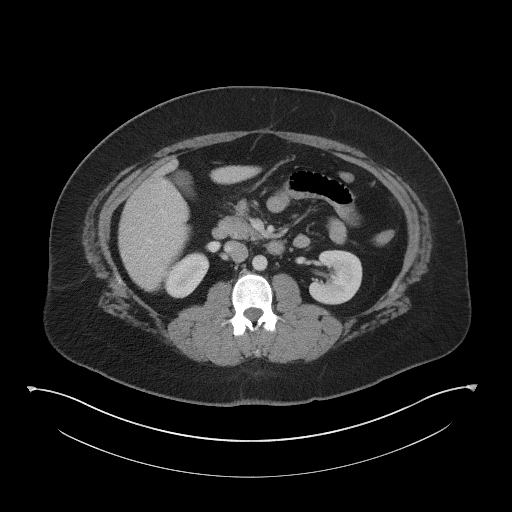
[im 72/95  soft-tissue]
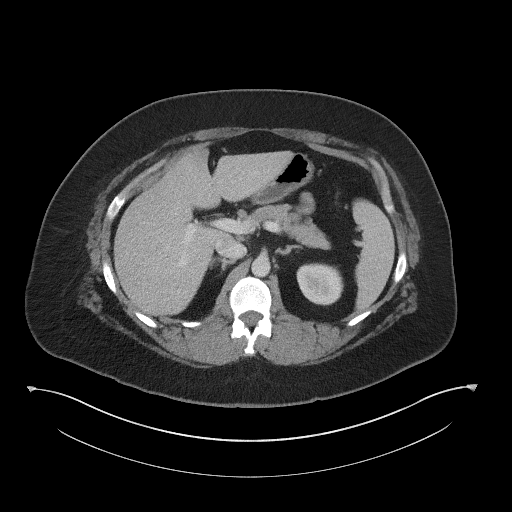
[im 76/95  soft-tissue]
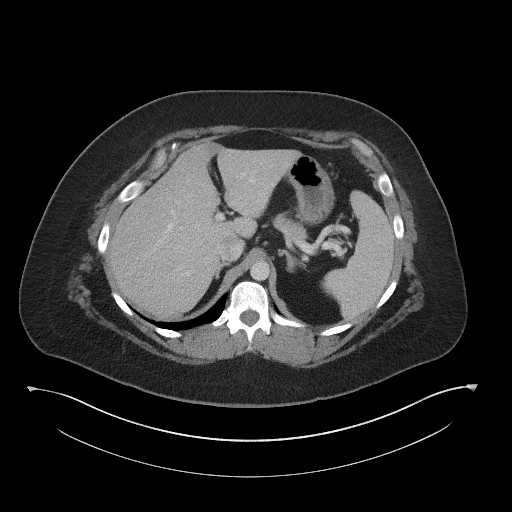
[im 83/95  soft-tissue]
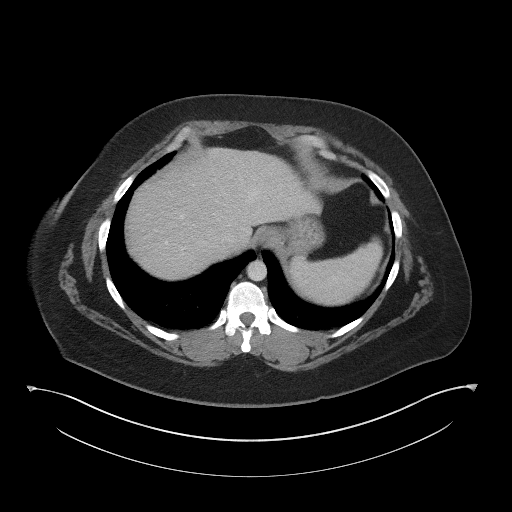
[im 91/95  soft-tissue]
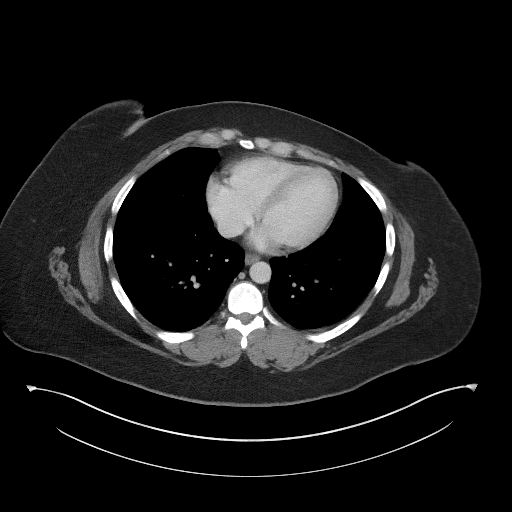

[Series 5: coronal st · coronal · 0.93mm/px · 3 of 100 slices shown]
[im 34/100  soft-tissue]
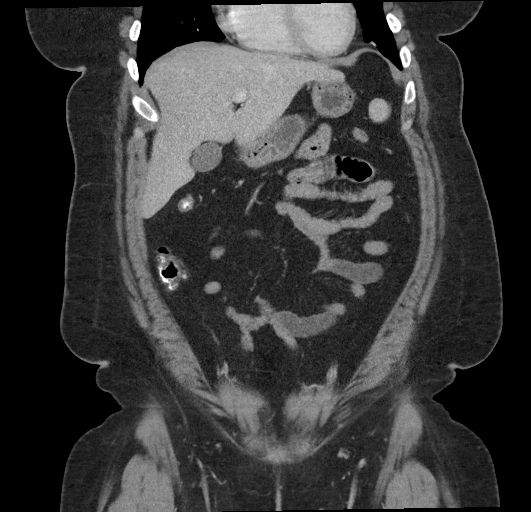
[im 45/100  soft-tissue]
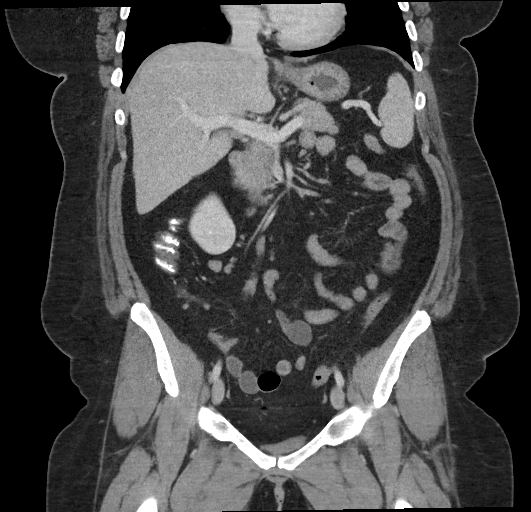
[im 56/100  soft-tissue]
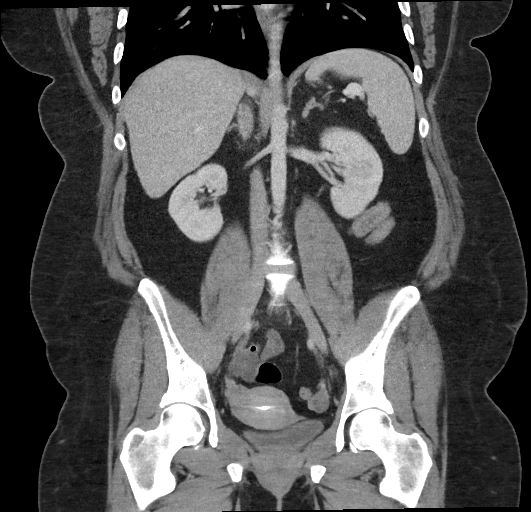

[17 of 46 positions shown; findings below may reference images not displayed]

FINDINGS: Lower chest: No significant pulmonary nodules or acute consolidative
airspace disease.

Hepatobiliary: Normal liver size. No liver mass. Suggestion of a
tiny layering calcified gallstone. Otherwise normal gallbladder. No
biliary ductal dilatation.

Pancreas: Normal, with no mass or duct dilation.

Spleen: Normal size. No mass.

Adrenals/Urinary Tract: Normal adrenals. Normal kidneys with no
hydronephrosis and no renal mass. Normal bladder.

Stomach/Bowel: Normal non-distended stomach. Normal caliber small
bowel with no small bowel wall thickening. Normal appendix. Oral
contrast transits to the colon. Normal large bowel with no
diverticulosis, large bowel wall thickening or pericolonic fat
stranding.

Vascular/Lymphatic: Normal caliber abdominal aorta. Patent portal,
splenic, hepatic and renal veins. No pathologically enlarged lymph
nodes in the abdomen or pelvis.

Reproductive: Normal anteverted uterus. Intrauterine device appears
grossly well-positioned in the uterine cavity. No adnexal masses.

Other: No pneumoperitoneum, ascites or focal fluid collection.

Musculoskeletal: No aggressive appearing focal osseous lesions.
IMPRESSION: 1. No acute abnormality. No evidence of bowel obstruction or acute
bowel inflammation. Normal appendix.
2. Suggestion of a tiny gallstone. No CT evidence of acute
cholecystitis. No biliary ductal dilatation.

## 2021-08-07 ENCOUNTER — Ambulatory Visit: Payer: Medicaid Other | Admitting: Dermatology

## 2022-10-31 ENCOUNTER — Encounter (HOSPITAL_COMMUNITY): Payer: Self-pay

## 2022-10-31 ENCOUNTER — Other Ambulatory Visit: Payer: Self-pay

## 2022-10-31 DIAGNOSIS — M79621 Pain in right upper arm: Secondary | ICD-10-CM | POA: Insufficient documentation

## 2022-10-31 DIAGNOSIS — R202 Paresthesia of skin: Secondary | ICD-10-CM | POA: Diagnosis not present

## 2022-10-31 DIAGNOSIS — M79622 Pain in left upper arm: Secondary | ICD-10-CM | POA: Insufficient documentation

## 2022-10-31 NOTE — ED Triage Notes (Signed)
Pt reports she donated plasma today and left the wraps in place for 3 hours instead of 1 hour and is now having tingling pains down her arms, left more then right.

## 2022-11-01 ENCOUNTER — Emergency Department (HOSPITAL_COMMUNITY)
Admission: EM | Admit: 2022-11-01 | Discharge: 2022-11-01 | Disposition: A | Discharge status: Home / Self Care | Payer: Medicaid Other | Attending: Emergency Medicine | Admitting: Emergency Medicine

## 2022-11-01 DIAGNOSIS — M79601 Pain in right arm: Secondary | ICD-10-CM

## 2022-11-01 DIAGNOSIS — R202 Paresthesia of skin: Secondary | ICD-10-CM

## 2022-11-01 NOTE — ED Provider Notes (Signed)
[facility] EMERGENCY DEPARTMENT Provider Note   CSN: [id number] Arrival date & time: 10/31/22  2058     History  Chief Complaint  Patient presents with   Arm Pain    Leslie Duran is a 32 y.o. female.  The history is provided by the patient.      Patient presents for bilateral arm pain since trying to give plasma earlier in the day Patient reports she lost her job recently was donating plasma to get money.  She reports they attempted to extract plasma from her left arm and started to hurt after they put the needle in.  She then reports they put a needle in her right arm and she having pain in that arm. After the procedure, she began having pain and numbness in both arms.  It was mostly in the forearms.  It is now improving.  No chest pain or shortness of breath.  No leg weakness.  No other acute complaints Home Medications Prior to Admission medications   Medication Sig Start Date End Date Taking? Authorizing Provider  acetaZOLAMIDE (DIAMOX) 500 MG capsule Take 500 mg by mouth 2 (two) times daily.    [provider]  buPROPion (WELLBUTRIN XL) 150 MG 24 hr tablet Take 150 mg by mouth daily.    [provider]  cetirizine (ZYRTEC) 10 MG tablet Take 10 mg by mouth daily.    [provider]  FLUoxetine (PROZAC) 20 MG capsule Take 1 capsule (20 mg total) by mouth daily after breakfast. Patient not taking: Reported on 01/09/2017 09/05/12 05/09/15  Donnamae Jude, MD  levonorgestrel (MIRENA) 20 MCG/24HR IUD 1 Intra Uterine Device (1 each total) by Intrauterine route once. Will place in clinic 01/24/13   Donnamae Jude, MD  pantoprazole (PROTONIX) 40 MG tablet Take 40 mg by mouth daily.    [provider]  promethazine (PHENERGAN) 12.5 MG tablet Take 1 tablet (12.5 mg total) by mouth every 6 (six) hours as needed. 08/09/19   Merlyn Lot, MD  sertraline (ZOLOFT) 100 MG tablet Take 100 mg by mouth daily. 05/22/19   [provider]  sucralfate  (CARAFATE) 1 g tablet Take 1 tablet (1 g total) by mouth 4 (four) times daily. 11/03/17   Nance Pear, MD      Allergies    Tomato    Review of Systems   Review of Systems  Physical Exam Updated Vital Signs BP 116/80   Pulse 66   Temp 97.9 F (36.6 C) (Oral)   Resp 18   Ht 1.676 m (5\' 6" )   Wt 112.5 kg   LMP 10/16/2022 (Approximate)   SpO2 95%   BMI 40.03 kg/m  Physical Exam CONSTITUTIONAL: Well developed/well nourished, anxious HEAD: Normocephalic/atraumatic EYES: EOMI/PERRL NEURO: Pt is awake/alert/appropriate, moves all extremitiesx4.  No facial droop.  Full range of motion of both arms. EXTREMITIES: pulses normal/equal, full ROM Distal pulses equal intact in both arms.  No deformities. SKIN: warm, color normal, both arms are warm to touch.  No discoloration of either arm.  No hematomas.  No edema. PSYCH: Anxious  ED Results / Procedures / Treatments   Labs (all labs ordered are listed, but only abnormal results are displayed) Labs Reviewed - No data to display  EKG None  Radiology No results found.  Procedures Procedures    Medications Ordered in ED Medications - No data to display  ED Course/ Medical Decision Making/ A&P  Medical Decision Making  Patient presents for she is having pain and numbness in her arms after giving plasma Patient appears neurovascularly intact on  my exam.  No hematomas noted on her arms She is safe for d/c home       Final Clinical Impression(s) / ED Diagnoses Final diagnoses:  Pain in both upper extremities  Paresthesia    Rx / DC Orders ED Discharge Orders     None         Zadie Rhine, MD 11/01/22 0403

## 2022-11-01 NOTE — ED Notes (Signed)
Went over dc papers. All questions answered. Ambulatory to lobby .  

## 2023-04-12 ENCOUNTER — Other Ambulatory Visit: Payer: Self-pay

## 2023-04-12 ENCOUNTER — Emergency Department
Admission: EM | Admit: 2023-04-12 | Discharge: 2023-04-12 | Disposition: A | Discharge status: Home / Self Care | Payer: Medicaid Other | Attending: Emergency Medicine | Admitting: Emergency Medicine

## 2023-04-12 DIAGNOSIS — K0889 Other specified disorders of teeth and supporting structures: Secondary | ICD-10-CM | POA: Insufficient documentation

## 2023-04-12 MED ORDER — NAPROXEN 500 MG PO TABS
250.0000 mg | ORAL_TABLET | Freq: Once | ORAL | Status: AC
  Administered 2023-04-12: 250 mg via ORAL
  Filled 2023-04-12: qty 1

## 2023-04-12 MED ORDER — NAPROXEN 250 MG PO TABS: 250.0000 mg | ORAL_TABLET | Freq: Two times a day (BID) | ORAL | 0 refills | Status: AC

## 2023-04-12 MED ORDER — AMOXICILLIN 500 MG PO CAPS
500.0000 mg | ORAL_CAPSULE | Freq: Once | ORAL | Status: AC
  Administered 2023-04-12: 500 mg via ORAL
  Filled 2023-04-12: qty 1

## 2023-04-12 MED ORDER — AMOXICILLIN 500 MG PO TABS: 500.0000 mg | ORAL_TABLET | Freq: Three times a day (TID) | ORAL | 0 refills | Status: AC

## 2023-04-12 NOTE — Discharge Instructions (Signed)
Please start taking the antibiotic 3 times a day for the next 7 days.  Stop taking ibuprofen and start taking the naproxen twice a day to see if this helps more with your pain.  Please follow-up with your dentist.

## 2023-04-12 NOTE — ED Triage Notes (Signed)
Pt sts that she is having lower right dental pain.

## 2023-04-12 NOTE — ED Provider Notes (Signed)
[facility] Provider Note    Event Date/Time   First MD Initiated Contact with Patient 04/12/23 1911     (approximate)   History   Dental Pain   HPI  Leslie Duran is a 32 y.o. female past medical history of depression who presents with dental pain.  About a week ago patient broke one of her right lower molars and is having significant pain since.  Does not improve with ibuprofen.  Denies swelling fevers chills.  She called her dentist but is not able to be seen until July 10     Past Medical History:  Diagnosis Date   Acid reflux    Constipation    Depression    Eye problems    No pertinent past medical history     Patient Active Problem List   Diagnosis Date Noted   ADHD (attention deficit hyperactivity disorder) 02/04/2017   Auditory processing disorder 02/04/2017   Pseudotumor cerebri syndrome 08/01/2014   Encounter for IUD insertion 01/24/2013   Depression 08/30/2012   Postpartum depression 08/26/2011     Physical Exam  Triage Vital Signs: ED Triage Vitals [04/12/23 1840]  Enc Vitals Group     BP (!) 129/92     Pulse Rate 87     Resp 19     Temp 98 F (36.7 C)     Temp Source Oral     SpO2 99 %     Weight 270 lb (122.5 kg)     Height 5\' 4"  (1.626 m)     Head Circumference      Peak Flow      Pain Score 8     Pain Loc      Pain Edu?      Excl. in GC?     Most recent vital signs: Vitals:   04/12/23 1840  BP: (!) 129/92  Pulse: 87  Resp: 19  Temp: 98 F (36.7 C)  SpO2: 99%     General: Awake, no distress.  CV:  Good peripheral perfusion.  Resp:  Normal effort.  Abd:  No distention.  Neuro:             Awake, Alert, Oriented x 3  Other:  No trismus or facial swelling Poor dentition, right first lower molar has dental caries and is cracked, tender to percussion, no fluctuance or swelling   ED Results / Procedures / Treatments  Labs (all labs ordered are listed, but only abnormal results are  displayed) Labs Reviewed - No data to display   EKG     RADIOLOGY    PROCEDURES:  Critical Care performed: No  Procedures  MEDICATIONS ORDERED IN ED: Medications  naproxen (NAPROSYN) tablet 250 mg (has no administration in time range)  amoxicillin (AMOXIL) capsule 500 mg (has no administration in time range)     IMPRESSION / MDM / ASSESSMENT AND PLAN / ED COURSE  I reviewed the triage vital signs and the nursing notes.                              Patient's presentation is most consistent with acute, uncomplicated illness.  Differential diagnosis includes, but is not limited to, dental caries, periapical abscess, pulpitis,   Patient is a 32 year old female who presents with dental pain x 1 week.  She broke one of her right lower molars about a week ago and is having significant pain since.  No  swelling or fever.  Has not responded to ibuprofen.  On exam she looks well has no facial swelling or trismus.  The right first molar is cracked with dental caries and it is tender to percussion but there is no fluctuance or swelling.  Suspect a pulpitis versus just pain related to the broken tooth.  Given patient is not able to see a dentist until next month will trial a course of antibiotics although not sure that this will help. Recommended switching to naproxen.  Given information to [facility] for follow-up as patient has Medicaid.      FINAL CLINICAL IMPRESSION(S) / ED DIAGNOSES   Final diagnoses:  Pain, dental     Rx / DC Orders   ED Discharge Orders     None        Note:  This document was prepared using Dragon voice recognition software and may include unintentional dictation errors.   Georga Hacking, MD 04/12/23 2021

## 2024-01-11 ENCOUNTER — Emergency Department

## 2024-01-11 ENCOUNTER — Emergency Department
Admission: EM | Admit: 2024-01-11 | Discharge: 2024-01-11 | Disposition: A | Discharge status: Home / Self Care | Attending: Student in an Organized Health Care Education/Training Program | Admitting: Student in an Organized Health Care Education/Training Program

## 2024-01-11 ENCOUNTER — Other Ambulatory Visit: Payer: Self-pay

## 2024-01-11 DIAGNOSIS — M79641 Pain in right hand: Secondary | ICD-10-CM | POA: Diagnosis not present

## 2024-01-11 DIAGNOSIS — R0789 Other chest pain: Secondary | ICD-10-CM | POA: Insufficient documentation

## 2024-01-11 DIAGNOSIS — R079 Chest pain, unspecified: Secondary | ICD-10-CM | POA: Diagnosis present

## 2024-01-11 DIAGNOSIS — M542 Cervicalgia: Secondary | ICD-10-CM | POA: Insufficient documentation

## 2024-01-11 DIAGNOSIS — R000 Tachycardia, unspecified: Secondary | ICD-10-CM | POA: Diagnosis not present

## 2024-01-11 DIAGNOSIS — Y92410 Unspecified street and highway as the place of occurrence of the external cause: Secondary | ICD-10-CM | POA: Insufficient documentation

## 2024-01-11 MED ORDER — ACETAMINOPHEN 500 MG PO TABS
1000.0000 mg | ORAL_TABLET | Freq: Once | ORAL | Status: AC
  Administered 2024-01-11: 1000 mg via ORAL
  Filled 2024-01-11: qty 2

## 2024-01-11 MED ORDER — CYCLOBENZAPRINE HCL 10 MG PO TABS
5.0000 mg | ORAL_TABLET | Freq: Once | ORAL | Status: AC
  Administered 2024-01-11: 5 mg via ORAL
  Filled 2024-01-11: qty 1

## 2024-01-11 MED ORDER — KETOROLAC TROMETHAMINE 30 MG/ML IJ SOLN: 30.0000 mg | Freq: Once | INTRAMUSCULAR | Status: DC

## 2024-01-11 MED ORDER — CYCLOBENZAPRINE HCL 10 MG PO TABS: 10.0000 mg | ORAL_TABLET | Freq: Three times a day (TID) | ORAL | 0 refills | Status: AC | PRN
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
## 2024-01-11 NOTE — ED Provider Notes (Signed)
-----------------------------------------   3:15 PM on 01/11/2024 -----------------------------------------  Blood pressure 119/85, pulse 96, temperature 98.3 F (36.8 C), temperature source Oral, resp. rate 18, height 5\' 4"  (1.626 m), weight 129.7 kg, last menstrual period 12/14/2023, SpO2 96%.  Assuming care from Dr. Roxan Hockey.  In short, Leslie Duran is a 33 y.o. female with a chief complaint of Optician, dispensing and Chest Pain .  Refer to the original H&P for additional details.  The current plan of care is to follow-up Cervical XR read.  ----------------------------------------- 4:15 PM on 01/11/2024 ----------------------------------------- Cervical spine x-ray is unremarkable, patient feeling well on reassessment and is appropriate for discharge home with outpatient follow-up.  She was counseled to return to the ED for new or worsening symptoms, patient agrees with plan.      Chesley Noon, MD 01/11/24 [phone]

## 2024-01-11 NOTE — ED Notes (Signed)
Md in with pt.   

## 2024-01-28 ENCOUNTER — Other Ambulatory Visit: Payer: Self-pay

## 2024-01-28 ENCOUNTER — Emergency Department
Admission: EM | Admit: 2024-01-28 | Discharge: 2024-01-28 | Disposition: A | Discharge status: Home / Self Care | Attending: Student in an Organized Health Care Education/Training Program | Admitting: Student in an Organized Health Care Education/Training Program

## 2024-01-28 DIAGNOSIS — K6289 Other specified diseases of anus and rectum: Secondary | ICD-10-CM | POA: Diagnosis present

## 2024-01-28 DIAGNOSIS — K602 Anal fissure, unspecified: Secondary | ICD-10-CM | POA: Diagnosis not present

## 2024-01-28 MED ORDER — NIFEDIPINE 0.3 % OINTMENT: 1.0000 | TOPICAL_OINTMENT | Freq: Four times a day (QID) | CUTANEOUS | 0 refills | Status: DC

## 2024-01-28 MED ORDER — NIFEDIPINE 0.3 % OINTMENT: 1.0000 | TOPICAL_OINTMENT | Freq: Four times a day (QID) | CUTANEOUS | 0 refills | Status: AC

## 2024-01-28 MED ORDER — LIDOCAINE 5 % EX OINT: 1.0000 | TOPICAL_OINTMENT | CUTANEOUS | 0 refills | Status: AC | PRN

## 2024-01-28 MED ORDER — LIDOCAINE 5 % EX OINT: 1.0000 | TOPICAL_OINTMENT | CUTANEOUS | 0 refills | Status: DC | PRN

## 2024-01-28 NOTE — Discharge Instructions (Addendum)
 Apply the nifedipine ointment rectally 4 times daily.  Apply the lidocaine cream before having a bowel movement.  Start taking a daily stool softener like MiraLAX.  And also take sitz bath's as needed.  Schedule follow-up appointment with GI.  Their information is attached.

## 2024-01-28 NOTE — ED Triage Notes (Signed)
 Pt to Ed via POV from home. Pt ambulatory to triage. Pt reports pain while having a BM. Pt reports there is protrusion from rectum. No hx of hemorrhoids or prolapse. Pt denies blood thinner.

## 2024-01-28 NOTE — ED Provider Notes (Signed)
 [facility] Provider Note    Event Date/Time   First MD Initiated Contact with Patient 01/28/24 1844     (approximate)   History   Rectal Pain   HPI  Leslie Duran is a 33 y.o. female with PMH of depression, GERD and constipation who presents for evaluation of rectal pain.  Patient states that she has severe pain when trying to have a bowel movement.  She describes it as a stabbing pain.  She is also noticed blood with bowel movements.  She states she has not been constipated recently.      Physical Exam   Triage Vital Signs: ED Triage Vitals  Encounter Vitals Group     BP 01/28/24 1812 (!) 141/102     Systolic BP Percentile --      Diastolic BP Percentile --      Pulse Rate 01/28/24 1812 94     Resp 01/28/24 1812 20     Temp 01/28/24 1812 98 F (36.7 C)     Temp Source 01/28/24 1812 Oral     SpO2 01/28/24 1812 98 %     Weight --      Height --      Head Circumference --      Peak Flow --      Pain Score 01/28/24 1811 7     Pain Loc --      Pain Education --      Exclude from Growth Chart --     Most recent vital signs: Vitals:   01/28/24 1812  BP: (!) 141/102  Pulse: 94  Resp: 20  Temp: 98 F (36.7 C)  SpO2: 98%   General: Awake, no distress.  CV:  Good peripheral perfusion.  Resp:  Normal effort.  Abd:  No distention.  Other:  Skin tags present, no visible hemorrhoids, tender to palpation on DRE in posterior midline.   ED Results / Procedures / Treatments   Labs (all labs ordered are listed, but only abnormal results are displayed) Labs Reviewed - No data to display   PROCEDURES:  Critical Care performed: No  Procedures   MEDICATIONS ORDERED IN ED: Medications - No data to display   IMPRESSION / MDM / ASSESSMENT AND PLAN / ED COURSE  I reviewed the triage vital signs and the nursing notes.                             33 year old female presents for evaluation of rectal pain.  Blood pressure is elevated  otherwise vital signs are stable.  Patient uncomfortable on exam.  Differential diagnosis includes, but is not limited to, anal fissure, hemorrhoids, rectal prolapse, constipation.  Patient's presentation is most consistent with acute, uncomplicated illness.  Patient's history and physical exam is consistent with anal fissure.  Will start her on rectal nifedipine cream and will also prescribe lidocaine cream.  Discussed taking stool softeners and laxatives to keep her regular.  Will give her follow-up with GI.  Patient voiced understanding, all questions were answered and she was stable at discharge.     FINAL CLINICAL IMPRESSION(S) / ED DIAGNOSES   Final diagnoses:  Anal fissure     Rx / DC Orders   ED Discharge Orders          Ordered    nifedipine 0.3 % ointment  4 times daily        01/28/24 1949    lidocaine (  XYLOCAINE) 5 % ointment  As needed        01/28/24 1949             Note:  This document was prepared using Dragon voice recognition software and may include unintentional dictation errors.   Cameron Ali, PA-C 01/28/24 1951    Willy Eddy, MD 01/28/24 2239

## 2024-01-31 ENCOUNTER — Telehealth: Payer: Self-pay
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
## 2024-05-02 ENCOUNTER — Other Ambulatory Visit: Payer: Self-pay

## 2024-05-02 DIAGNOSIS — R1032 Left lower quadrant pain: Secondary | ICD-10-CM | POA: Diagnosis present

## 2024-05-02 LAB — CBC
HCT: 41.2 % (ref 36.0–46.0)
Hemoglobin: 13.7 g/dL (ref 12.0–15.0)
MCH: 29.7 pg (ref 26.0–34.0)
MCHC: 33.3 g/dL (ref 30.0–36.0)
MCV: 89.2 fL (ref 80.0–100.0)
Platelets: 452 K/uL — ABNORMAL HIGH (ref 150–400)
RBC: 4.62 MIL/uL (ref 3.87–5.11)
RDW: 12.3 % (ref 11.5–15.5)
WBC: 10.1 K/uL (ref 4.0–10.5)
nRBC: 0 % (ref 0.0–0.2)

## 2024-05-02 LAB — POC URINE PREG, ED: Preg Test, Ur: NEGATIVE

## 2024-05-02 NOTE — ED Triage Notes (Signed)
 Pt arrived via POV with reports of feeling like someone is grabbing her L ovary x 2 weeks, pain is worsening. Denies any N/V/D.

## 2024-05-03 ENCOUNTER — Emergency Department

## 2024-05-03 ENCOUNTER — Emergency Department
Admission: EM | Admit: 2024-05-03 | Discharge: 2024-05-03 | Disposition: A | Discharge status: Home / Self Care | Attending: Emergency Medicine | Admitting: Emergency Medicine

## 2024-05-03 DIAGNOSIS — R1032 Left lower quadrant pain: Secondary | ICD-10-CM

## 2024-05-03 LAB — COMPREHENSIVE METABOLIC PANEL WITH GFR
ALT: 20 U/L (ref 0–44)
AST: 17 U/L (ref 15–41)
Albumin: 3.6 g/dL (ref 3.5–5.0)
Alkaline Phosphatase: 32 U/L — ABNORMAL LOW (ref 38–126)
Anion gap: 12 (ref 5–15)
BUN: 16 mg/dL (ref 6–20)
CO2: 24 mmol/L (ref 22–32)
Calcium: 9.2 mg/dL (ref 8.9–10.3)
Chloride: 104 mmol/L (ref 98–111)
Creatinine, Ser: 0.83 mg/dL (ref 0.44–1.00)
GFR, Estimated: >60 mL/min (ref >60)
Glucose, Bld: 156 mg/dL — ABNORMAL HIGH (ref 70–99)
Potassium: 4.1 mmol/L (ref 3.5–5.1)
Sodium: 140 mmol/L (ref 135–145)
Total Bilirubin: 0.6 mg/dL (ref 0.0–1.2)
Total Protein: 6.8 g/dL (ref 6.5–8.1)

## 2024-05-03 LAB — URINALYSIS, ROUTINE W REFLEX MICROSCOPIC
Bacteria, UA: NONE SEEN
Bilirubin Urine: NEGATIVE
Glucose, UA: NEGATIVE mg/dL
Hgb urine dipstick: NEGATIVE
Ketones, ur: NEGATIVE mg/dL
Nitrite: NEGATIVE
Protein, ur: NEGATIVE mg/dL
Specific Gravity, Urine: 1.019 (ref 1.005–1.030)
pH: 5 (ref 5.0–8.0)

## 2024-05-03 LAB — WET PREP, GENITAL
Clue Cells Wet Prep HPF POC: NONE SEEN
Sperm: NONE SEEN
Trich, Wet Prep: NONE SEEN
WBC, Wet Prep HPF POC: <10 (ref <10)
Yeast Wet Prep HPF POC: NONE SEEN

## 2024-05-03 LAB — CHLAMYDIA/NGC RT PCR (ARMC ONLY)
Chlamydia Tr: NOT DETECTED
N gonorrhoeae: NOT DETECTED

## 2024-05-03 LAB — LIPASE, BLOOD: Lipase: 27 U/L (ref 11–51)

## 2024-05-03 MED ORDER — ACETAMINOPHEN 500 MG PO TABS
1000.0000 mg | ORAL_TABLET | Freq: Once | ORAL | Status: AC
  Administered 2024-05-03: 1000 mg via ORAL
  Filled 2024-05-03: qty 2

## 2024-05-03 MED ORDER — KETOROLAC TROMETHAMINE 30 MG/ML IJ SOLN
30.0000 mg | Freq: Once | INTRAMUSCULAR | Status: AC
  Administered 2024-05-03: 30 mg via INTRAMUSCULAR
  Filled 2024-05-03: qty 1

## 2024-05-03 MED ORDER — IOHEXOL 300 MG/ML  SOLN
100.0000 mL | Freq: Once | INTRAMUSCULAR | Status: AC | PRN
  Administered 2024-05-03: 100 mL via INTRAVENOUS

## 2024-05-03 NOTE — ED Provider Notes (Addendum)
 [facility] Provider Note    Event Date/Time   First MD Initiated Contact with Patient 05/03/24 [phone]     (approximate)   History   Abdominal Pain   HPI  Leslie Duran is a 33 y.o. female with IUD who comes in with concerns for left lower quadrant pain.  Patient reports some left lower quadrant pain for the past 2 weeks that has been intermittent in nature but has gotten more worse which is why she presented today.  She reports being with 1 sexual partner and denies any new vaginal discharge.  She denies any pain in her back.  Denies any urinary symptoms.  Denies any right sided pain.    Physical Exam   Triage Vital Signs: ED Triage Vitals  Encounter Vitals Group     BP 05/02/24 2343 (!) 134/95     Girls Systolic BP Percentile --      Girls Diastolic BP Percentile --      Boys Systolic BP Percentile --      Boys Diastolic BP Percentile --      Pulse Rate 05/02/24 2343 92     Resp 05/02/24 2343 18     Temp 05/02/24 2343 98.1 F (36.7 C)     Temp Source 05/02/24 2343 Oral     SpO2 05/02/24 2343 99 %     Weight 05/02/24 2340 300 lb (136.1 kg)     Height 05/02/24 2340 5' 4 (1.626 m)     Head Circumference --      Peak Flow --      Pain Score 05/02/24 2339 8     Pain Loc --      Pain Education --      Exclude from Growth Chart --     Most recent vital signs: Vitals:   05/02/24 2343  BP: (!) 134/95  Pulse: 92  Resp: 18  Temp: 98.1 F (36.7 C)  SpO2: 99%     General: Awake, no distress.  CV:  Good peripheral perfusion.  Resp:  Normal effort.  Abd:  No distention.  Slight tenderness in the left lower abdomen.  No rebound, no guarding. Other:     ED Results / Procedures / Treatments   Labs (all labs ordered are listed, but only abnormal results are displayed) Labs Reviewed  COMPREHENSIVE METABOLIC PANEL WITH GFR - Abnormal; Notable for the following components:      Result Value   Glucose, Bld 156 (*)    Alkaline Phosphatase  32 (*)    All other components within normal limits  CBC - Abnormal; Notable for the following components:   Platelets 452 (*)    All other components within normal limits  URINALYSIS, ROUTINE W REFLEX MICROSCOPIC - Abnormal; Notable for the following components:   Color, Urine YELLOW (*)    APPearance CLEAR (*)    Leukocytes,Ua TRACE (*)    All other components within normal limits  LIPASE, BLOOD  POC URINE PREG, ED     RADIOLOGY I have reviewed the us  personally and interpreted no signs of cyst   PROCEDURES:  Critical Care performed: No  Procedures   MEDICATIONS ORDERED IN ED: Medications  acetaminophen  (TYLENOL ) tablet 1,000 mg (1,000 mg Oral Given 05/03/24 0301)  ketorolac  (TORADOL ) 30 MG/ML injection 30 mg (30 mg Intramuscular Given 05/03/24 0301)     IMPRESSION / MDM / ASSESSMENT AND PLAN / ED COURSE  I reviewed the triage vital signs and the nursing notes.  Patient's presentation is most consistent with acute presentation with potential threat to life or bodily function.   Differential includes pregnancy, ovarian torsion, IUD malposition.  Considered STDs but seems less likely but will have patient self swab.  Pregnancy test was negative lipase normal CMP reassuring CBC normal urine without evidence of UTI no RBCs to suggest kidney stone.  US  negative-   Patient still having symptoms and we discussed pros and cons of CT imaging and she would like to proceed with CT.  Patient handed off pending CT imaging but if negative suspect discharge home  Reevaluated patient and updated that if CT is negative she will be going home she expressed understanding felt comfortable with this plan to get a PCP follow-up for Thursday.  1. No acute CT findings in the abdomen or pelvis.  2. Scattered diverticulosis without evidence of diverticulitis.  3. Cholelithiasis.  4. Hepatomegaly with mild steatosis.  5. 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   6. Small umbilical fat hernia.    Incidental findings discussed with pt and recommend to discuss with PCP to see if needs further imaging and workup. Given the enlarged lymph node nbot sure if she would need biopsy which I discussed that she should discuss with PCP and she has appointment on Thursday and will go over these findings.   FINAL CLINICAL IMPRESSION(S) / ED DIAGNOSES   Final diagnoses:  Left lower quadrant abdominal pain     Rx / DC Orders   ED Discharge Orders     None        Note:  This document was prepared using Dragon voice recognition software and may include unintentional dictation errors.   Ernest Ronal BRAVO, MD 05/03/24 9372    Ernest Ronal BRAVO, MD 05/03/24 9285    Ernest Ronal BRAVO, MD 05/03/24 9275    Ernest Ronal BRAVO, MD 05/03/24 [phone]

## 2024-05-03 NOTE — ED Provider Notes (Signed)
 Care assumed of patient from outgoing provider.  See their note for initial history, exam and plan.  Clinical Course as of 05/03/24 0710  Wed May 03, 2024  0709 Presents with abdominal pain.  No significant improvement with pain medication.  Lab work overall reassuring.  Ultrasound unremarkable.  CT scan on my evaluation without acute findings.  Waiting for CT scan read and if CT scan is read as no acute findings plan to discharge home with outpatient follow-up. [SM]    Clinical Course User Index [SM] Suzanne Kirsch, MD     Suzanne Kirsch, MD 05/03/24 [phone]

## 2024-05-03 NOTE — Discharge Instructions (Addendum)
 Your workup was reassuring and you should follow-up with your primary care doctor return to the ER if you develop worsening symptoms or any other concerns  1. No acute CT findings in the abdomen or pelvis.  2. Scattered diverticulosis without evidence of diverticulitis.  3. Cholelithiasis.  4. Hepatomegaly with mild steatosis.  5. 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  6. Small umbilical fat hernia.

## 2024-11-22 ENCOUNTER — Other Ambulatory Visit: Payer: Self-pay

## 2024-11-22 DIAGNOSIS — N6323 Unspecified lump in the left breast, lower outer quadrant: Secondary | ICD-10-CM

## 2025-01-16 ENCOUNTER — Other Ambulatory Visit

## 2025-01-16 ENCOUNTER — Encounter

## 2025-01-16 ENCOUNTER — Ambulatory Visit
